# Patient Record
Sex: Male | Born: 1940
Health system: Southern US, Community
[De-identification: ages and names within clinical notes are randomized; demographics above are authoritative.]

## PROBLEM LIST (undated history)

## (undated) DIAGNOSIS — I483 Typical atrial flutter: Principal | ICD-10-CM

## (undated) DIAGNOSIS — E785 Hyperlipidemia, unspecified: Secondary | ICD-10-CM

## (undated) DIAGNOSIS — R42 Dizziness and giddiness: Secondary | ICD-10-CM

## (undated) DIAGNOSIS — R0789 Other chest pain: Secondary | ICD-10-CM

## (undated) DIAGNOSIS — I10 Essential (primary) hypertension: Secondary | ICD-10-CM

## (undated) DIAGNOSIS — I219 Acute myocardial infarction, unspecified: Secondary | ICD-10-CM

## (undated) DIAGNOSIS — I429 Cardiomyopathy, unspecified: Secondary | ICD-10-CM

## (undated) DIAGNOSIS — I472 Ventricular tachycardia: Secondary | ICD-10-CM

## (undated) DIAGNOSIS — N289 Disorder of kidney and ureter, unspecified: Secondary | ICD-10-CM

## (undated) DIAGNOSIS — I2510 Atherosclerotic heart disease of native coronary artery without angina pectoris: Secondary | ICD-10-CM

## (undated) DIAGNOSIS — I255 Ischemic cardiomyopathy: Secondary | ICD-10-CM

## (undated) DIAGNOSIS — E782 Mixed hyperlipidemia: Secondary | ICD-10-CM

## (undated) DIAGNOSIS — I25709 Atherosclerosis of coronary artery bypass graft(s), unspecified, with unspecified angina pectoris: Secondary | ICD-10-CM

## (undated) DIAGNOSIS — K219 Gastro-esophageal reflux disease without esophagitis: Secondary | ICD-10-CM

## (undated) HISTORY — DX: Typical atrial flutter: I48.3

## (undated) HISTORY — DX: Mixed hyperlipidemia: E78.2

## (undated) HISTORY — DX: Ventricular tachycardia: I47.2

## (undated) HISTORY — PX: TONSILLECTOMY: SUR1361

## (undated) HISTORY — PX: BACK SURGERY: SHX140

## (undated) HISTORY — DX: Atherosclerosis of coronary artery bypass graft(s), unspecified, with unspecified angina pectoris: I25.709

## (undated) HISTORY — DX: Cardiomyopathy, unspecified: I42.9

## (undated) HISTORY — DX: Essential (primary) hypertension: I10

## (undated) HISTORY — PX: CORONARY ARTERY BYPASS GRAFT: SHX141

## (undated) HISTORY — PX: CARDIAC SURGERY: SHX584

## (undated) HISTORY — PX: ADENOIDECTOMY: SUR15

## (undated) HISTORY — DX: Dizziness and giddiness: R42

## (undated) HISTORY — DX: Other chest pain: R07.89

---

## 2004-06-29 ENCOUNTER — Encounter: Payer: Self-pay | Admitting: Cardiology

## 2009-06-07 ENCOUNTER — Ambulatory Visit: Payer: Self-pay | Admitting: Orthopedic Surgery

## 2009-06-07 DIAGNOSIS — M7580 Other shoulder lesions, unspecified shoulder: Secondary | ICD-10-CM

## 2009-06-07 DIAGNOSIS — M25519 Pain in unspecified shoulder: Secondary | ICD-10-CM

## 2009-09-29 ENCOUNTER — Encounter: Payer: Self-pay | Admitting: Cardiology

## 2009-11-03 ENCOUNTER — Encounter: Payer: Self-pay | Admitting: Cardiology

## 2009-11-17 ENCOUNTER — Ambulatory Visit: Payer: Self-pay | Admitting: Cardiology

## 2009-11-17 DIAGNOSIS — E782 Mixed hyperlipidemia: Secondary | ICD-10-CM

## 2009-11-17 DIAGNOSIS — I429 Cardiomyopathy, unspecified: Secondary | ICD-10-CM

## 2009-11-17 DIAGNOSIS — I25709 Atherosclerosis of coronary artery bypass graft(s), unspecified, with unspecified angina pectoris: Secondary | ICD-10-CM | POA: Insufficient documentation

## 2009-11-17 DIAGNOSIS — F17200 Nicotine dependence, unspecified, uncomplicated: Secondary | ICD-10-CM

## 2009-11-17 DIAGNOSIS — I10 Essential (primary) hypertension: Secondary | ICD-10-CM

## 2009-11-17 HISTORY — DX: Mixed hyperlipidemia: E78.2

## 2009-11-17 HISTORY — DX: Atherosclerosis of coronary artery bypass graft(s), unspecified, with unspecified angina pectoris: I25.709

## 2009-11-17 HISTORY — DX: Essential (primary) hypertension: I10

## 2009-11-17 HISTORY — DX: Cardiomyopathy, unspecified: I42.9

## 2009-11-19 ENCOUNTER — Ambulatory Visit: Payer: Self-pay | Admitting: Cardiology

## 2009-12-07 ENCOUNTER — Telehealth (INDEPENDENT_AMBULATORY_CARE_PROVIDER_SITE_OTHER): Payer: Self-pay | Admitting: *Deleted

## 2009-12-29 ENCOUNTER — Ambulatory Visit: Payer: Self-pay | Admitting: Cardiology

## 2009-12-29 ENCOUNTER — Encounter: Payer: Self-pay | Admitting: Cardiology

## 2010-01-10 ENCOUNTER — Ambulatory Visit: Payer: Self-pay | Admitting: Cardiology

## 2010-01-10 DIAGNOSIS — I255 Ischemic cardiomyopathy: Secondary | ICD-10-CM

## 2010-02-07 ENCOUNTER — Encounter: Payer: Self-pay | Admitting: Cardiology

## 2010-02-15 ENCOUNTER — Ambulatory Visit: Payer: Self-pay | Admitting: Cardiology

## 2010-05-24 NOTE — Assessment & Plan Note (Signed)
Summary: DISCUSS STRESS TEST RESULTS PER DR. Ashlon Lottman   Visit Type:  Follow-up Primary Provider:  Dr. Dwana Melena   History of Present Illness: 70 year old male presents for followup. I saw him in July as a new patient with history detailed below. He was referred for a followup 2-D echocardiogram to reassess LVEF. This study revealed an LVEF of 25-30% with wall motion abnormalities. Followup Cardiolite was then undertaken to assess ischemic burden with findings of fairly extensive scar affecting the anterior, septal, and inferior apical regions with LVEF of 23%. No ischemia was noted.  I reviewed these test results with the patient in detail today, and discussed the implications. He remains stable clinically without any active angina, and NYHA class II dyspnea on exertion. We discussed his medical therapy, and also reviewed the issue of potential prophylactic defibrillator placement. He states that this has not been discussed with him at any time previously. He was not prepared to make a decision about this today, however.  Heart rate has been slow overall. We discussed attempting to reduce carvedilol dosing, perhaps also to allow some blood pressure for the institution of low-dose ACE inhibitor. This would round out his medical regimen.  We also discussed smoking cessation. He states that he is considering the matter, and will likely be able to come to a quit date fairly soon. We talked about the possibility of using Wellbutrin and nicotine replacement.  Preventive Screening-Counseling & Management  Alcohol-Tobacco     Smoking Status: current     Smoking Cessation Counseling: yes     Packs/Day: 3/4 PPD     Year Started: 1958  Current Medications (verified): 1)  Carvedilol 12.5 Mg Tabs (Carvedilol) .... Take 1/2 Tab (6.25mg ) Two Times A Day 2)  Spironolactone 25 Mg Tabs (Spironolactone) .... Take 1 Tablet By Mouth Once A Day 3)  Lipitor 40 Mg Tabs (Atorvastatin Calcium) .... Take 1 Tablet By  Mouth Once A Day 4)  Aspirin 325 Mg Tabs (Aspirin) .... Take 2 Tablet By Mouth Once A Day  Allergies (verified): 1)  ! Sulfa  Comments:  Nurse/Medical Assistant: The patient's medication list and allergies were reviewed with the patient and were updated in the Medication and Allergy Lists.  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         Otherwise reviewed and negative.  Vital Signs:  Patient profile:   70 year old male Height:      74 inches Weight:      243 pounds Pulse rate:   40 / minute BP sitting:   108 / 70  (left arm) Cuff size:   large  Vitals Entered By: Carlye Grippe (January 10, 2010 3:39 PM)   Physical Exam  Additional Exam:  Obese male in no acute distress. HEENT: Conjunctiva and lids normal, oropharynx clear. Neck: Supple, no elevated JVP or bruits. Lungs: Clear to auscultation, nonlabored. Cardiac: Regular rate and rhythm, no S3 or rub.  Abdomen: Obese, nontender, bowel sounds present, no bruits. Skin: Warm and dry. Extremities: No pitting edema, distal pulses 1-2+. Musculoskeletal: No gross deformities. Neuropsychiatric: Alert and oriented x3, affect appropriate.   Echocardiogram  Procedure date:  11/19/2009  Findings:      Mild LV chamber dilatation with mild LVH, LVEF 25-30%, inferolateral and inferior  hypokinesis with anteroseptal and apical akinesis. Mitral annual calcification with mild mitral regurgitation. Mild aortic leaflet calcification with annular calcification. Trace tricuspid regurgitation.  Nuclear Study  Procedure date:  12/29/2009  Findings:      Lexiscan Cardiolite with no diagnostic ST changes. LVEF 23%. Evidence of extensive scar in the anterior, septal, inferior, and apical region. No significant ischemia  Impression & Recommendations:  Problem # 1:  ISCHEMIC CARDIOMYOPATHY (ICD-414.8)  As noted above, LVEF approximately 25%, with Cardiolite demonstrating fairly extensive scar but no active ischemia. He is symptomatically stable with no angina and NYHA class II dyspnea on exertion. I discussed the implications of his testing, plans for medical therapy modification, and also potential referral to electrophysiology to discuss prophylactic defibrillator placement. He would like to consider this matter further before making such a visit. I plan to see him back in one month.  His updated medication list for this problem includes:    Carvedilol 12.5 Mg Tabs (Carvedilol) .Marland Kitchen... Take 1/2 tab (6.25mg ) two times a day    Spironolactone 25 Mg Tabs (Spironolactone) .Marland Kitchen... Take 1 tablet by mouth once a day    Aspirin 325 Mg Tabs (Aspirin) .Marland Kitchen... Take 2 tablet by mouth once a day  Problem # 2:  TOBACCO ABUSE (ICD-305.1)  We continue to discuss smoking cessation. If he is able to reach a quit date, we will likely end up utilizing Wellbutrin SR and nicotine replacement.  Problem # 3:  CORONARY ATHEROSCLEROSIS NATIVE CORONARY ARTERY (ICD-414.01)  No active angina. Patient fairly bradycardic on carvedilol at present dose. Plan to reduce from 12.5 mg twice daily to 6.25 mg twice daily.  His updated medication list for this problem includes:    Carvedilol 12.5 Mg Tabs (Carvedilol) .Marland Kitchen... Take 1/2 tab (6.25mg ) two times a day    Aspirin 325 Mg Tabs (Aspirin) .Marland Kitchen... Take 2 tablet by mouth  once a day  Problem # 4:  ESSENTIAL HYPERTENSION, BENIGN (ICD-401.1)  Blood pressure very well controlled, in fact limits utilization of ACE inhibitor at this time.  His updated medication list for this problem includes:    Carvedilol 12.5 Mg Tabs (Carvedilol) .Marland Kitchen... Take 1/2 tab (6.25mg ) two times a day    Spironolactone 25 Mg Tabs (Spironolactone) .Marland Kitchen... Take 1 tablet by mouth once a day    Aspirin 325 Mg Tabs (Aspirin) .Marland Kitchen... Take 2 tablet by mouth once a day  Patient Instructions: 1)  Decrease Coreg to 6.25mg  two times a day  2)  Follow up in  1 month

## 2010-05-24 NOTE — Letter (Signed)
Summary: History form  History form   Imported By: Jacklynn Ganong 06/14/2009 08:16:05  _____________________________________________________________________  External Attachment:    Type:   Image     Comment:   External Document

## 2010-05-24 NOTE — Letter (Signed)
Summary: External Correspondence/ [facility] RECORDS  External Correspondence/ [facility]   Imported By: Dorise Hiss 11/25/2009 14:03:47  _____________________________________________________________________  External Attachment:    Type:   Image     Comment:   External Document  Appended Document: External Correspondence/ [facility] Records reviewed. Patient with anterior wall MI in 1999 treated with thrombolytics, bare-metal stent to the proximal LAD 1999 with unsuccessful wiring of the mid LAD, subsequent Cypher drug-eluting stent placement to the right coronary artery x2 in 2005, and ultimately coronary artery bypass grafting in March 2006. Cardiac catheterization at that time demonstrated a left ventricular ejection fraction of 20%, occlusion of the right coronary artery distally at site of previous stent placement, 50-60% circumflex stenosis, patent stent site in the proximal left anterior descending with occlusion in the mid vessel segment, 99% second diagonal stenosis. Patient underwent placement of an SVG to the circumflex and diagonal.

## 2010-05-24 NOTE — Assessment & Plan Note (Signed)
Summary: RT SHOULDER PAIN/NEEDS XRAY/REF Z.HALL/?[facility]   Visit Type:  Initial Consult  CC:  right shoulder pain.  History of Present Illness: I saw Harold English in the office today for an initial visit.  He is a 70 years old man with the complaint of:  RIGHT SHOULDER PAIN.  The patient started having pain 3-4 months ago and complained of sharp mild to severe pain which is intermittent and worse with certain motions of the arm.  He says he did have an injury he doesn't know the date.  He does get relief with aspirin but gets increased pain with lifting his arm.  Xrays today, in our office.  Medications: Aspirin, heart medicine? 3 pills.  Pain for 3-4 months.  Allergies (verified): 1)  ! Sulfa  Past History:  Past Medical History: Bypass  Past Surgical History: Heart bypass Back  Review of Systems General:  Complains of fever, chills, and fatigue; denies weight loss and weight gain. Cardiac :  Complains of chest pain; denies angina, heart attack, heart failure, poor circulation, blood clots, and phlebitis; murmur. Resp:  Complains of short of breath and cough; denies difficulty breathing, COPD, and pneumonia. GI:  Complains of nausea, vomiting, and diarrhea; denies constipation, difficulty swallowing, ulcers, GERD, and reflux; heartburn. GU:  Denies kidney failure, kidney transplant, kidney stones, burning, poor stream, testicular cancer, blood in urine, and . Neuro:  Complains of numbness; denies headache, dizziness, migraines, weakness, tremor, and unsteady walking; tingling. MS:  Complains of joint pain; denies rheumatoid arthritis, joint swelling, gout, bone cancer, osteoporosis, and ; stiffness and muscle pain . Endo:  Denies thyroid disease, goiter, and diabetes. Psych:  Denies depression, mood swings, anxiety, panic attack, bipolar, and schizophrenia. Derm:  Denies eczema, cancer, and itching; changes in the skin. EENT:  Denies poor vision, cataracts, glaucoma, poor  hearing, vertigo, ears ringing, sinusitis, hoarseness, toothaches, and bleeding gums; blurred vision, eye pain, and headache. Immunology:  Denies seasonal allergies, sinus problems, and allergic to bee stings. Lymphatic:  Denies lymph node cancer and lymph edema.  Physical Exam  Additional Exam:  GEN: well developed, well nourished, normal grooming and hygiene, no deformity and normal body habitus. vital stable as recorded  CDV: pulses are normal, no edema, no erythema. no tenderness  Lymph: normal lymph nodes   Skin: no rashes, skin lesions or open sores   NEURO: normal coordination, reflexes, sensation.   Psyche: awake, alert and oriented. Mood normal   Gait: normal  RIGHT shoulder inspection reveals tenderness along the anterolateral acromial and deltoid areas.  His range of motion remains full.  He has grade 5 motor strength.  His shoulder is stable.  He has a positive impingement sign.  His LEFT shoulder is nontender, has full range of motion with grade 5 strength and no joint laxity     Impression & Recommendations:  Problem # 1:  IMPINGEMENT SYNDROME (ICD-726.2) Assessment New  Orders: New Patient Level III (56213) Shoulder x-ray,  minimum 2 views (08657) 2 views of the glenohumeral joint   The glenohumeral joint space is normal. The bony anatomy is without bone lesion. The acromion is a Type II normal shoulder  Problem # 2:  SHOULDER PAIN (ICD-719.41) Assessment: New  I think we can manage him with a simple exercise program he will try this first and see how things go if no improvement and he will return  Orders: New Patient Level III (84696) Shoulder x-ray,  minimum 2 views (29528)  Patient Instructions: 1)  Limit  activity to comfort and avoid activities that increase discomfort.  Apply moist heat and/or ice to shoulder and take medication as instructed for pain relief. Please read the Shoulder Pain Handout and start exercises  as directed. 2)  return if  still having pain after 3 weeks [call for appointment]

## 2010-05-24 NOTE — Assessment & Plan Note (Signed)
Summary: NP-ELEVATED BNP   Visit Type:  Initial Consult Primary Provider:  Dr. Dwana Melena   History of Present Illness: 70 year old male presents to establish cardiology followup.  He previously lived at [facility], relocated to [place], and had his prior cardiology followup in [place], [place]. He denies any significant angina and reports NYHA class II dyspnea on exertion. He states that he and his wife have been remodeling a house, although not specifically exercising regularly.  I reviewed his available records which are summarized below.  He reports undergoing a stress test approximately one year ago that was "okay." Not certain about most recent assessment of LVEF.  He denies any problems with palpitations, syncope, orthopnea, or PND.  He had a recent full lipid profile with particle size assessment per Dr. Margo Aye. These results were scanned into EMR. LDL was most recently in the 120s. His Lipitor was advanced to 40 mg daily.  I do not have his bypass graft anatomy as yet for review.  Preventive Screening-Counseling & Management  Alcohol-Tobacco     Smoking Status: current     Smoking Cessation Counseling: yes     Packs/Day: 1 PPD  Current Medications (verified): 1)  Carvedilol 12.5 Mg Tabs (Carvedilol) .... Take 1 Tablet By Mouth Two Times A Day 2)  Spironolactone 25 Mg Tabs (Spironolactone) .... Take 1 Tablet By Mouth Once A Day 3)  Lipitor 40 Mg Tabs (Atorvastatin Calcium) .... Take 1 Tablet By Mouth Once A Day 4)  Aspir-Trin 325 Mg Tbec (Aspirin) .... Take 1 Tablet By Mouth Once A Day  Allergies (verified): 1)  ! Sulfa  Comments:  Nurse/Medical Assistant: The patient's medications bottles and allergies were reviewed with the patient and were updated in the Medication and Allergy Lists.  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  Past Medical History: Hyperlipidemia Hypertension Basal cell skin cancer CAD - presumably multivessel, prior BMS and DES to LAD 2004, LVEF 35% Myocardial Infarction - 2004 BPH Arthritis Cluster headaches  Past Surgical History: Back surgery Tonsillectomy CABG 2004, [place]  Family History: Father: "heart problems " Mother: liver cancer 2 Uncles with "heart problems"  Social History: Married  Tobacco Use - Yes Drug Use - no 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         Otherwise reviewed and negative.  Vital Signs:  Patient profile:   70 year old male Height:      74 inches Weight:      246 pounds BMI:     31.70 Pulse rate:   96 / minute BP sitting:   115 / 73  (left arm) Cuff size:   large  Vitals Entered By: Carlye Grippe (November 17, 2009 8:22 AM)  Nutrition Counseling: Patient's BMI is greater than 25 and therefore counseled on weight management options.  Physical Exam  Additional Exam:  Obese male in no acute distress. HEENT: Conjunctiva and lids normal, oropharynx clear. Neck: Supple, no elevated JVP or bruits. Lungs: Clear to auscultation, nonlabored. Cardiac: Regular rate and  rhythm, no S3 or rub. Abdomen: Obese, nontender, bowel sounds present, no bruits. Skin: Warm and dry. Extremities: No pitting edema, distal pulses 1-2+. Musculoskeletal: No gross deformities. Neuropsychiatric: Alert and oriented x3, affect appropriate.   EKG  Procedure date:  11/17/2009  Findings:      Sinus rhythm at 75 beats per minute with premature  ventricular complexes and right bundle branch block pattern.  Impression & Recommendations:  Problem # 1:  CORONARY ATHEROSCLEROSIS NATIVE CORONARY ARTERY (ICD-414.01)  Symptomatically stable without active angina on medical therapy. Patient reports a reassuring stress test approximately one year ago in [place]. I reviewed his medications today. We discussed rationale for possible adjustments over time. I recommended that he consider a regular walking regimen for exercise. Will request bypass graft records. A 6 month visit will be scheduled.  His updated medication list for this problem includes:    Carvedilol 12.5 Mg Tabs (Carvedilol) .Marland Kitchen... Take 1 tablet by mouth two times a day    Aspir-trin 325 Mg Tbec (Aspirin) .Marland Kitchen... Take 1 tablet by mouth once a day  Orders: 2-D Echocardiogram (2D Echo)  Problem # 2:  UNSPECIFIED SECONDARY CARDIOMYOPATHY (ICD-425.9)  LVEF reported at 35% as of 2004 based on available information. A followup echocardiogram will be obtained. This may help to guide further medication adjustments.  His updated medication list for this problem includes:    Carvedilol 12.5 Mg Tabs (Carvedilol) .Marland Kitchen... Take 1 tablet by mouth two times a day    Spironolactone 25 Mg Tabs (Spironolactone) .Marland Kitchen... Take 1 tablet by mouth once a day    Aspir-trin 325 Mg Tbec (Aspirin) .Marland Kitchen... Take 1 tablet by mouth once a day  Problem # 3:  MIXED HYPERLIPIDEMIA (ICD-272.2)  Recent lipid profile with particle size assessment is reviewed. LDL is not at goal. Agree with increase in Lipitor to 40 mg daily. Followup labs to be checked by Dr. Margo Aye over the next few months.  His updated medication list for this problem includes:    Lipitor 40 Mg Tabs (Atorvastatin calcium) .Marland Kitchen... Take 1 tablet by mouth once a day  Problem # 4:  TOBACCO ABUSE (ICD-305.1)  We discussed smoking cessation today. Patient tried Chantix in the past without success. He was able to quit for a period of time more remotely. We  talked about trying to pick a quit date along with his wife who also smokes, and we might consider using Wellbutrin and subsequently a combination of nicotine replacement therapy.  Other Orders: EKG w/ Interpretation (93000)  Patient Instructions: 1)  Your physician has requested that you have an echocardiogram.  Echocardiography is a painless test that uses sound waves to create images of your heart. It provides your doctor with information about the size and shape of your heart and how well your heart's chambers and valves are working.  This procedure takes approximately one hour. There are no restrictions for this procedure. If the results of your test are normal or stable, you will receive a letter. If they are abnormal, the nurse will contact you by phone.  2)  Your physician wants you to follow-up in: 6 months. You will receive a reminder letter in the mail one-two months in advance. If you don't receive a letter, please call our office to schedule the follow-up appointment. 3)  Your physician discussed the hazards of tobacco use.  Tobacco use cessation is recommended and techniques and options to help you quit were discussed.

## 2010-05-24 NOTE — Progress Notes (Signed)
Summary: PHONE: MEDICATIONS AND LEXISCAN   Phone Note Call from Patient Call back at Home Phone [phone]   Caller: wife- ann Reason for Call: Talk to Nurse Summary of Call: Spironolactone 25 Mg Tabs (Spironolactone) .Marland Kitchen... Take 1 tablet by mouth once a day  Called patient to verify his Lexiscan cardiolite and his wife wanted to know about taking his fluid pill before the test. Told her that I would check with you and that you would give her a call back regarding medications before his test.  Initial call taken by: Zachary George,  December 07, 2009 3:31 PM  Follow-up for Phone Call        Pt states he usually takes Spironolactone at night. He is aware he can take this medication before the stress test unless he feels this will make him have to urinate frequently. Pt verbalized understanding.  Follow-up by: Cyril Loosen, RN, BSN,  December 07, 2009 5:10 PM

## 2010-05-24 NOTE — Assessment & Plan Note (Signed)
Summary: f50m --agh   Visit Type:  Follow-up Primary Provider:  Dr. Dwana Melena   History of Present Illness: 70 year old male presents for follow-up. He was just recently seen in September. At that visit we discussed his cardiac testing with findings of LVEF approximately 25%, Cardiolite demonstrating fairly extensive scar but no active ischemia. He was symptomatically stable with no angina and NYHA class II dyspnea on exertion. I discussed the implications of his testing, plans for medical therapy modification, and also potential referral to electrophysiology to discuss prophylactic defibrillator placement.   He has elected not to pursue consideration of the fibrillator at this time. He may reconsider later date.  He did note some relative increase in his energy after cutting Coreg in half, however has more ectopy and heart rate has increased, not as optimally controlled.  He did pick a smoking cessation quit date on November 1. He has already been started on Wellbutrin SR by Dr. Margo Aye.  I asked him to start using nicotine patches on his quit date. I also gave him the [employer] number.  Preventive Screening-Counseling & Management  Alcohol-Tobacco     Smoking Status: current     Packs/Day: 0.5     Year Started: 50 years  Comments: trying to quit, set date of November 1  Current Medications (verified): 1)  Carvedilol 12.5 Mg Tabs (Carvedilol) .... Take 1/2 Tab (6.25mg ) Two Times A Day 2)  Spironolactone 25 Mg Tabs (Spironolactone) .... Take 1 Tablet By Mouth Once A Day 3)  Lipitor 40 Mg Tabs (Atorvastatin Calcium) .... Take 1 Tablet By Mouth Once A Day 4)  Aspirin 325 Mg Tabs (Aspirin) .... Take 2 Tablet By Mouth Once A Day 5)  Wellbutrin Xl 150 Mg Xr24h-Tab (Bupropion Hcl) .... Take 1 Tablet By Mouth Once A Day  Allergies: 1)  ! 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         Otherwise reviewed and negative.  Vital Signs:  Patient profile:   70 year old male Height:      74 inches Weight:      244 pounds Pulse rate:   90 / minute BP sitting:   109 / 67  (right arm) Cuff size:   regular  Vitals Entered By: Hoover Brunette, LPN (February 15, 2010 2:49 PM) Is Patient Diabetic? No   Physical Exam  Additional Exam:  Obese male in no acute distress. HEENT: Conjunctiva and lids normal, oropharynx clear. Neck: Supple, no elevated JVP or bruits. Lungs: Clear to auscultation, nonlabored. Cardiac: Regular rate and rhythm with frequent ectopy, no S3 or rub. Abdomen: Obese, nontender, bowel sounds present, no bruits. Skin: Warm and dry. Extremities: No pitting edema, distal pulses 1-2+. Musculoskeletal: No gross deformities. Neuropsychiatric: Alert and oriented x3, affect appropriate.   Prior Report Reviewed for  Echocardiogram:  Findings: 11/19/2009 Mild LV chamber dilatation with mild LVH, LVEF 25-30%, inferolateral and inferior hypokinesis with anteroseptal and apical akinesis. Mitral annual calcification with mild mitral regurgitation. Mild aortic leaflet calcification with annular calcification. Trace tricuspid regurgitation.  Comments:    Prior Report Reviewed for Nuclear Study:  Findings: 12/29/2009 Lexiscan  Cardiolite with no diagnostic ST changes. LVEF 23%. Evidence of extensive scar in the anterior, septal, inferior, and apical region. No significant ischemia  Impression & Recommendations:  Problem # 1:  ISCHEMIC CARDIOMYOPATHY (ICD-414.8)  Continue medical therapy at the present time. Recent ischemic workup is noted above demonstrating no active ischemia. Patient is not interested in pursuing a defibrillator at this time. I will continue to discuss this with him over time. Plan to increase Coreg back to 12.5 mg b.i.d. Followup arranged for the next 3 months.  His updated medication list for this problem includes:    Carvedilol 12.5 Mg Tabs (Carvedilol) .Marland Kitchen... Take 1 tablet by mouth two times a day    Spironolactone 25 Mg Tabs (Spironolactone) .Marland Kitchen... Take 1 tablet by mouth once a d     Aspirin 325 Mg Tabs (Aspirin) .Marland Kitchen... Take 2 tablet by mouth once a day  Problem # 2:  TOBACCO ABUSE (ICD-305.1)  Quit date is November 1. He is already on Wellbutrin SR. Plan is to use nicotine patches. Quit Line number is also provided.  Problem # 3:  ESSENTIAL HYPERTENSION, BENIGN (ICD-401.1)  Blood pressure very well controlled.  His updated medication list for this problem includes:    Carvedilol 12.5 Mg Tabs (Carvedilol) .Marland Kitchen... Take 1 tablet by mouth two times a day    Spironolactone 25 Mg Tabs (Spironolactone) .Marland Kitchen... Take 1 tablet by mouth once a day    Aspirin 325 Mg Tabs (Aspirin) .Marland Kitchen... Take 2 tablet by mouth once a day  Problem # 4:  MIXED HYPERLIPIDEMIA (ICD-272.2)  Will review recent labs obtained by Dr. Margo Aye.  His updated medication list for this problem includes:    Lipitor 40 Mg Tabs (Atorvastatin calcium) .Marland Kitchen... Take 1 tablet by mouth once a day  Patient Instructions: 1)  Your physician wants you to follow-up in: 3 months. You will receive a reminder letter in the mail one-two months in advance. If you don't receive a letter, please call our office to schedule the follow-up appointment. 2)   Increase Coreg (carvedilol) to 12.5mg  1 tablet by mouth two times a day.  3)  We have requested your recent labs from your primary MD.

## 2010-05-24 NOTE — Letter (Signed)
Summary: External Correspondence/ OFFICE VISIT DR. ZACK HALL  External Correspondence/ OFFICE VISIT DR. ZACK HALL   Imported By: Dorise Hiss 11/01/2009 10:36:48  _____________________________________________________________________  External Attachment:    Type:   Image     Comment:   External Document

## 2010-05-24 NOTE — Medication Information (Signed)
Summary: RX Folder/ MED LIST DR. ZACK HALL  RX Folder/ MED LIST DR. ZACK HALL   Imported By: Dorise Hiss 11/04/2009 12:08:20  _____________________________________________________________________  External Attachment:    Type:   Image     Comment:   External Document

## 2013-09-06 ENCOUNTER — Emergency Department (HOSPITAL_COMMUNITY)
Admission: EM | Admit: 2013-09-06 | Discharge: 2013-09-06 | Disposition: A | Discharge status: Home / Self Care | Payer: 59 | Attending: Emergency Medicine | Admitting: Emergency Medicine

## 2013-09-06 ENCOUNTER — Encounter (HOSPITAL_COMMUNITY): Payer: Self-pay | Admitting: Emergency Medicine

## 2013-09-06 ENCOUNTER — Emergency Department (HOSPITAL_COMMUNITY): Payer: 59

## 2013-09-06 DIAGNOSIS — I252 Old myocardial infarction: Secondary | ICD-10-CM | POA: Insufficient documentation

## 2013-09-06 DIAGNOSIS — Z9889 Other specified postprocedural states: Secondary | ICD-10-CM | POA: Insufficient documentation

## 2013-09-06 DIAGNOSIS — Z951 Presence of aortocoronary bypass graft: Secondary | ICD-10-CM | POA: Insufficient documentation

## 2013-09-06 DIAGNOSIS — R062 Wheezing: Secondary | ICD-10-CM

## 2013-09-06 DIAGNOSIS — J189 Pneumonia, unspecified organism: Secondary | ICD-10-CM

## 2013-09-06 DIAGNOSIS — F17200 Nicotine dependence, unspecified, uncomplicated: Secondary | ICD-10-CM | POA: Insufficient documentation

## 2013-09-06 DIAGNOSIS — J3489 Other specified disorders of nose and nasal sinuses: Secondary | ICD-10-CM | POA: Insufficient documentation

## 2013-09-06 DIAGNOSIS — J159 Unspecified bacterial pneumonia: Secondary | ICD-10-CM | POA: Insufficient documentation

## 2013-09-06 HISTORY — DX: Acute myocardial infarction, unspecified: I21.9

## 2013-09-06 MED ORDER — BENZONATATE 100 MG PO CAPS: 200.0000 mg | ORAL_CAPSULE | Freq: Three times a day (TID) | ORAL | Status: DC

## 2013-09-06 MED ORDER — PREDNISONE 20 MG PO TABS: 60.0000 mg | ORAL_TABLET | Freq: Every day | ORAL | Status: DC

## 2013-09-06 MED ORDER — ALBUTEROL SULFATE HFA 108 (90 BASE) MCG/ACT IN AERS
2.0000 | INHALATION_SPRAY | RESPIRATORY_TRACT | Status: DC | PRN
  Filled 2013-09-06: qty 6.7

## 2013-09-06 MED ORDER — IPRATROPIUM-ALBUTEROL 0.5-2.5 (3) MG/3ML IN SOLN
3.0000 mL | Freq: Once | RESPIRATORY_TRACT | Status: AC
Start: 2013-09-06 — End: 2013-09-06
  Administered 2013-09-06: 3 mL via RESPIRATORY_TRACT
  Filled 2013-09-06: qty 3

## 2013-09-06 MED ORDER — CEFTRIAXONE SODIUM 250 MG IJ SOLR
250.0000 mg | Freq: Once | INTRAMUSCULAR | Status: AC
  Administered 2013-09-06: 250 mg via INTRAMUSCULAR
  Filled 2013-09-06: qty 250

## 2013-09-06 MED ORDER — AZITHROMYCIN 250 MG PO TABS: 250.0000 mg | ORAL_TABLET | Freq: Every day | ORAL | Status: DC

## 2013-09-06 MED ORDER — ALBUTEROL SULFATE (2.5 MG/3ML) 0.083% IN NEBU
2.5000 mg | INHALATION_SOLUTION | Freq: Once | RESPIRATORY_TRACT | Status: AC
  Administered 2013-09-06: 2.5 mg via RESPIRATORY_TRACT
  Filled 2013-09-06: qty 3

## 2013-09-06 MED ORDER — PREDNISONE 50 MG PO TABS
60.0000 mg | ORAL_TABLET | Freq: Once | ORAL | Status: AC
  Administered 2013-09-06: 60 mg via ORAL
  Filled 2013-09-06 (×2): qty 1

## 2013-09-06 MED ORDER — AZITHROMYCIN 250 MG PO TABS
500.0000 mg | ORAL_TABLET | Freq: Once | ORAL | Status: AC
  Administered 2013-09-06: 500 mg via ORAL
  Filled 2013-09-06: qty 2

## 2013-09-06 NOTE — Discharge Instructions (Signed)
Pneumonia, Adult °Pneumonia is an infection of the lungs.  °CAUSES °Pneumonia may be caused by bacteria or a virus. Usually, these infections are caused by breathing infectious particles into the lungs (respiratory tract). °SYMPTOMS  °· Cough. °· Fever. °· Chest pain. °· Increased rate of breathing. °· Wheezing. °· Mucus production. °DIAGNOSIS  °If you have the common symptoms of pneumonia, your caregiver will typically confirm the diagnosis with a chest X-ray. The X-ray will show an abnormality in the lung (pulmonary infiltrate) if you have pneumonia. Other tests of your blood, urine, or sputum may be done to find the specific cause of your pneumonia. Your caregiver may also do tests (blood gases or pulse oximetry) to see how well your lungs are working. °TREATMENT  °Some forms of pneumonia may be spread to other people when you cough or sneeze. You may be asked to wear a mask before and during your exam. Pneumonia that is caused by bacteria is treated with antibiotic medicine. Pneumonia that is caused by the influenza virus may be treated with an antiviral medicine. Most other viral infections must run their course. These infections will not respond to antibiotics.  °PREVENTION °A pneumococcal shot (vaccine) is available to prevent a common bacterial cause of pneumonia. This is usually suggested for: °· People over 65 years old. °· Patients on chemotherapy. °· People with chronic lung problems, such as bronchitis or emphysema. °· People with immune system problems. °If you are over 65 or have a high risk condition, you may receive the pneumococcal vaccine if you have not received it before. In some countries, a routine influenza vaccine is also recommended. This vaccine can help prevent some cases of pneumonia. You may be offered the influenza vaccine as part of your care. °If you smoke, it is time to quit. You may receive instructions on how to stop smoking. Your caregiver can provide medicines and counseling to  help you quit. °HOME CARE INSTRUCTIONS  °· Cough suppressants may be used if you are losing too much rest. However, coughing protects you by clearing your lungs. You should avoid using cough suppressants if you can. °· Your caregiver may have prescribed medicine if he or she thinks your pneumonia is caused by a bacteria or influenza. Finish your medicine even if you start to feel better. °· Your caregiver may also prescribe an expectorant. This loosens the mucus to be coughed up. °· Only take over-the-counter or prescription medicines for pain, discomfort, or fever as directed by your caregiver. °· Do not smoke. Smoking is a common cause of bronchitis and can contribute to pneumonia. If you are a smoker and continue to smoke, your cough may last several weeks after your pneumonia has cleared. °· A cold steam vaporizer or humidifier in your room or home may help loosen mucus. °· Coughing is often worse at night. Sleeping in a semi-upright position in a recliner or using a couple pillows under your head will help with this. °· Get rest as you feel it is needed. Your body will usually let you know when you need to rest. °SEEK IMMEDIATE MEDICAL CARE IF:  °· Your illness becomes worse. This is especially true if you are elderly or weakened from any other disease. °· You cannot control your cough with suppressants and are losing sleep. °· You begin coughing up blood. °· You develop pain which is getting worse or is uncontrolled with medicines. °· You have a fever. °· Any of the symptoms which initially brought you in for treatment   are getting worse rather than better.  You develop shortness of breath or chest pain. MAKE SURE YOU:   Understand these instructions.  Will watch your condition.  Will get help right away if you are not doing well or get worse. Document Released: 04/10/2005 Document Revised: 07/03/2011 Document Reviewed: 06/30/2010 [facility] Institute[facility] Patient Information 2014 [place], [place].   Take your next  dose of Zithromax tomorrow evening.  Use your albuterol inhaler as instructed, using 2 puffs every 4 hours if you are wheezing.  Take your next dose of prednisone also tomorrow evening.

## 2013-09-06 NOTE — ED Notes (Signed)
Pt walked to end of hall without assistance. No complaints. Pulse Ox 94-97% for duration of walk. Stated he was not having dizziness with walking.

## 2013-09-06 NOTE — ED Provider Notes (Signed)
CSN: [id number]     Arrival date & time 09/06/13  1556 History   First MD Initiated Contact with Patient 09/06/13 1624     Chief Complaint  Patient presents with  . Nasal Congestion  . Cough     (Consider location/radiation/quality/duration/timing/severity/associated sxs/prior Treatment) HPI Comments: Harold English is a 73 y.o. Male with a history significant for MI and cigarette abuse presenting with a one week history of cough,productive of clear sputum, nasal congestion with clear rhinorrhea along with wheezing and lung "crackles".  He denies chest pain.  His symptoms worsen at night, especially when lying on his left side.  His symptoms started shortly after cleaning heavily mildewed furniture with vinegar one week ago.  He denies hemoptysis and peripheral edema.  He endorses possible low grade fever.  He has taken no medicines for his symptoms.     The history is provided by the patient.    Past Medical History  Diagnosis Date  . MI (myocardial infarction)    Past Surgical History  Procedure Laterality Date  . Cardiac surgery    . Coronary artery bypass graft    . Back surgery    . Tonsillectomy    . Adenoidectomy     History reviewed. No pertinent family history. History  Substance Use Topics  . Smoking status: Current Every Day Smoker -- 0.75 packs/day for 52 years    Types: Cigarettes  . Smokeless tobacco: Never Used  . Alcohol Use: 2.4 oz/week    4 Shots of liquor per week    Review of Systems  Constitutional: Positive for fever. Negative for chills.  HENT: Positive for congestion and rhinorrhea. Negative for ear pain, sinus pressure, sore throat, trouble swallowing and voice change.   Eyes: Negative for discharge.  Respiratory: Positive for cough, shortness of breath and wheezing. Negative for stridor.   Cardiovascular: Negative for chest pain, palpitations and leg swelling.  Gastrointestinal: Negative for abdominal pain.  Genitourinary: Negative.        Allergies  Sulfonamide derivatives  Home Medications   Prior to Admission medications   Not on File   BP 124/79  Pulse 80  Temp(Src) 98.4 F (36.9 C) (Oral)  Resp 20  Ht 6\' 1"  (1.854 m)  Wt 240 lb (108.863 kg)  BMI 31.67 kg/m2  SpO2 92% Physical Exam  Nursing note and vitals reviewed. Constitutional: He appears well-developed and well-nourished.  HENT:  Head: Normocephalic and atraumatic.  Eyes: Conjunctivae are normal.  Neck: Normal range of motion.  Cardiovascular: Normal rate, regular rhythm, normal heart sounds and intact distal pulses.   Pulmonary/Chest: Effort normal. He has wheezes.  Left sided expiratory wheezing with prolonged respirations, bilateral base rhonchi.  Abdominal: Soft. Bowel sounds are normal. There is no tenderness.  Musculoskeletal: Normal range of motion. He exhibits no edema and no tenderness.  Neurological: He is alert.  Skin: Skin is warm and dry.  Psychiatric: He has a normal mood and affect.    ED Course  Procedures (including critical care time) Labs Review Labs Reviewed - No data to display  Imaging Review Dg Chest 2 View  09/06/2013   CLINICAL DATA:  Cough, congestion  EXAM: CHEST  2 VIEW  COMPARISON:  None.  FINDINGS: Normal cardiac and mediastinal contours status post median sternotomy. Minimal bandlike opacity right mid lung. No pleural effusion or pneumothorax. Regional skeleton is unremarkable.  IMPRESSION: Minimal right mid lung bandlike opacity. Early infiltrate is not entirely excluded. Recommend clinical correlation and possible short-term followup  as clinically indicated.   Electronically Signed   By: Annia Beltrew  Davis M.D.   On: 09/06/2013 17:53     EKG Interpretation   Date/Time:  Saturday Sep 06 2013 18:19:28 EDT Ventricular Rate:  82 PR Interval:  184 QRS Duration: 144 QT Interval:  438 QTC Calculation: 511 R Axis:   89 Text Interpretation:  Sinus rhythm with Premature supraventricular  complexes and with  frequent and consecutive Premature ventricular  complexes Possible Left atrial enlargement Right bundle branch block  Anteroseptal infarct , age undetermined Abnormal ECG No previous ECGs  available in a pattern of bigeminy Confirmed by Manus GunningANCOUR  MD, STEPHEN  [phone] on 09/06/2013 6:27:23 PM      MDM   Final diagnoses:  Community acquired pneumonia  Wheezing    Patients labs and/or radiological studies were viewed and considered during the medical decision making and disposition process.  Patient was also by Dr. Manus Gunningancour prior to discharge home.  Patient was ambulated in the hallway without desaturation and no complaints of shortness of breath, lightheadedness or weakness.  He was given Rocephin IM, Zithromax 500 mg by mouth and prescription for remaining Zithromax.  He was also given an albuterol MDI for home use every 4 hours, prednisone pulse dose and Tessalon to help with coughing.  He was encouraged close followup with his PCP this week, sooner here if symptoms worsen in any way.    Burgess AmorJulie Promiss Labarbera, PA-C 09/06/13 1926

## 2013-09-06 NOTE — ED Notes (Signed)
Pt ambulated without difficulty. Denied dizziness, CP and SOB.

## 2013-09-06 NOTE — ED Notes (Signed)
Patient c/o chest congestion with dry, "hacky" cough. Per patient started x1 week ago after cleaning some furniture and pieces that had been sitting in garage for "a long time" with vinegar. Patient unsure of any fevers.

## 2013-09-07 NOTE — ED Provider Notes (Signed)
Medical screening examination/treatment/procedure(s) were conducted as a shared visit with non-physician practitioner(s) and myself.  I personally evaluated the patient during the encounter.  Cough and congestion x 1 week. No chest pain. Smoker. Started after cleaning mildew.  No distress. No wheezing on my exam.   EKG Interpretation   Date/Time:  Saturday Sep 06 2013 18:19:28 EDT Ventricular Rate:  82 PR Interval:  184 QRS Duration: 144 QT Interval:  438 QTC Calculation: 511 R Axis:   89 Text Interpretation:  Sinus rhythm with Premature supraventricular  complexes and with frequent and consecutive Premature ventricular  complexes Possible Left atrial enlargement Right bundle branch block  Anteroseptal infarct , age undetermined Abnormal ECG No previous ECGs  available in a pattern of bigeminy Confirmed by Manus GunningANCOUR  MD, Tawny Raspberry  [phone]([id number]) on 09/06/2013 6:27:23 PM       Glynn OctaveStephen Kelce Bouton, MD 09/07/13 0111

## 2017-09-16 ENCOUNTER — Inpatient Hospital Stay (HOSPITAL_COMMUNITY)
Admission: EM | Admit: 2017-09-16 | Discharge: 2017-09-19 | DRG: 247 | Disposition: A | Discharge status: Home Health | Payer: Medicare Other | Attending: Internal Medicine | Admitting: Internal Medicine

## 2017-09-16 ENCOUNTER — Encounter (HOSPITAL_COMMUNITY): Payer: Self-pay | Admitting: Cardiology

## 2017-09-16 ENCOUNTER — Emergency Department (HOSPITAL_COMMUNITY): Payer: Medicare Other

## 2017-09-16 DIAGNOSIS — Z6828 Body mass index (BMI) 28.0-28.9, adult: Secondary | ICD-10-CM | POA: Diagnosis not present

## 2017-09-16 DIAGNOSIS — I252 Old myocardial infarction: Secondary | ICD-10-CM

## 2017-09-16 DIAGNOSIS — I25709 Atherosclerosis of coronary artery bypass graft(s), unspecified, with unspecified angina pectoris: Secondary | ICD-10-CM | POA: Diagnosis present

## 2017-09-16 DIAGNOSIS — I472 Ventricular tachycardia, unspecified: Secondary | ICD-10-CM

## 2017-09-16 DIAGNOSIS — I4510 Unspecified right bundle-branch block: Secondary | ICD-10-CM | POA: Diagnosis present

## 2017-09-16 DIAGNOSIS — Z79899 Other long term (current) drug therapy: Secondary | ICD-10-CM

## 2017-09-16 DIAGNOSIS — Z9089 Acquired absence of other organs: Secondary | ICD-10-CM | POA: Diagnosis not present

## 2017-09-16 DIAGNOSIS — I214 Non-ST elevation (NSTEMI) myocardial infarction: Secondary | ICD-10-CM | POA: Diagnosis present

## 2017-09-16 DIAGNOSIS — Z8249 Family history of ischemic heart disease and other diseases of the circulatory system: Secondary | ICD-10-CM

## 2017-09-16 DIAGNOSIS — K761 Chronic passive congestion of liver: Secondary | ICD-10-CM | POA: Diagnosis present

## 2017-09-16 DIAGNOSIS — N189 Chronic kidney disease, unspecified: Secondary | ICD-10-CM

## 2017-09-16 DIAGNOSIS — R0789 Other chest pain: Secondary | ICD-10-CM | POA: Diagnosis present

## 2017-09-16 DIAGNOSIS — I25110 Atherosclerotic heart disease of native coronary artery with unstable angina pectoris: Secondary | ICD-10-CM | POA: Diagnosis not present

## 2017-09-16 DIAGNOSIS — Z95820 Peripheral vascular angioplasty status with implants and grafts: Secondary | ICD-10-CM | POA: Diagnosis not present

## 2017-09-16 DIAGNOSIS — Z951 Presence of aortocoronary bypass graft: Secondary | ICD-10-CM

## 2017-09-16 DIAGNOSIS — I2510 Atherosclerotic heart disease of native coronary artery without angina pectoris: Secondary | ICD-10-CM | POA: Diagnosis present

## 2017-09-16 DIAGNOSIS — I130 Hypertensive heart and chronic kidney disease with heart failure and stage 1 through stage 4 chronic kidney disease, or unspecified chronic kidney disease: Secondary | ICD-10-CM | POA: Diagnosis present

## 2017-09-16 DIAGNOSIS — I959 Hypotension, unspecified: Secondary | ICD-10-CM | POA: Diagnosis present

## 2017-09-16 DIAGNOSIS — N179 Acute kidney failure, unspecified: Secondary | ICD-10-CM | POA: Diagnosis present

## 2017-09-16 DIAGNOSIS — D72829 Elevated white blood cell count, unspecified: Secondary | ICD-10-CM | POA: Diagnosis present

## 2017-09-16 DIAGNOSIS — N183 Chronic kidney disease, stage 3 (moderate): Secondary | ICD-10-CM | POA: Diagnosis present

## 2017-09-16 DIAGNOSIS — I255 Ischemic cardiomyopathy: Secondary | ICD-10-CM | POA: Diagnosis present

## 2017-09-16 DIAGNOSIS — I5022 Chronic systolic (congestive) heart failure: Secondary | ICD-10-CM | POA: Diagnosis present

## 2017-09-16 DIAGNOSIS — Z882 Allergy status to sulfonamides status: Secondary | ICD-10-CM

## 2017-09-16 DIAGNOSIS — R627 Adult failure to thrive: Secondary | ICD-10-CM | POA: Diagnosis present

## 2017-09-16 DIAGNOSIS — N289 Disorder of kidney and ureter, unspecified: Secondary | ICD-10-CM

## 2017-09-16 DIAGNOSIS — I4891 Unspecified atrial fibrillation: Secondary | ICD-10-CM

## 2017-09-16 DIAGNOSIS — F17200 Nicotine dependence, unspecified, uncomplicated: Secondary | ICD-10-CM | POA: Diagnosis not present

## 2017-09-16 DIAGNOSIS — I10 Essential (primary) hypertension: Secondary | ICD-10-CM | POA: Diagnosis present

## 2017-09-16 DIAGNOSIS — I480 Paroxysmal atrial fibrillation: Secondary | ICD-10-CM | POA: Diagnosis present

## 2017-09-16 DIAGNOSIS — Z716 Tobacco abuse counseling: Secondary | ICD-10-CM | POA: Diagnosis not present

## 2017-09-16 DIAGNOSIS — N170 Acute kidney failure with tubular necrosis: Secondary | ICD-10-CM | POA: Diagnosis not present

## 2017-09-16 DIAGNOSIS — Z955 Presence of coronary angioplasty implant and graft: Secondary | ICD-10-CM

## 2017-09-16 DIAGNOSIS — K219 Gastro-esophageal reflux disease without esophagitis: Secondary | ICD-10-CM | POA: Diagnosis present

## 2017-09-16 DIAGNOSIS — R739 Hyperglycemia, unspecified: Secondary | ICD-10-CM | POA: Diagnosis present

## 2017-09-16 DIAGNOSIS — F17210 Nicotine dependence, cigarettes, uncomplicated: Secondary | ICD-10-CM | POA: Diagnosis present

## 2017-09-16 DIAGNOSIS — E782 Mixed hyperlipidemia: Secondary | ICD-10-CM | POA: Diagnosis present

## 2017-09-16 DIAGNOSIS — I361 Nonrheumatic tricuspid (valve) insufficiency: Secondary | ICD-10-CM | POA: Diagnosis not present

## 2017-09-16 DIAGNOSIS — Z7982 Long term (current) use of aspirin: Secondary | ICD-10-CM

## 2017-09-16 HISTORY — DX: Ischemic cardiomyopathy: I25.5

## 2017-09-16 HISTORY — DX: Essential (primary) hypertension: I10

## 2017-09-16 HISTORY — DX: Ventricular tachycardia, unspecified: I47.20

## 2017-09-16 HISTORY — DX: Other chest pain: R07.89

## 2017-09-16 HISTORY — DX: Gastro-esophageal reflux disease without esophagitis: K21.9

## 2017-09-16 HISTORY — DX: Disorder of kidney and ureter, unspecified: N28.9

## 2017-09-16 HISTORY — DX: Hyperlipidemia, unspecified: E78.5

## 2017-09-16 HISTORY — DX: Atherosclerotic heart disease of native coronary artery without angina pectoris: I25.10

## 2017-09-16 HISTORY — DX: Ventricular tachycardia: I47.2

## 2017-09-16 LAB — CBC
HCT: 43.5 % (ref 39.0–52.0)
HEMOGLOBIN: 14 g/dL (ref 13.0–17.0)
MCH: 30.3 pg (ref 26.0–34.0)
MCHC: 32.2 g/dL (ref 30.0–36.0)
MCV: 94.2 fL (ref 78.0–100.0)
Platelets: 150 10*3/uL (ref 150–400)
RBC: 4.62 MIL/uL (ref 4.22–5.81)
RDW: 13.9 % (ref 11.5–15.5)
WBC: 10.6 10*3/uL — ABNORMAL HIGH (ref 4.0–10.5)

## 2017-09-16 LAB — CBC WITH DIFFERENTIAL/PLATELET
BASOS PCT: 0 %
Basophils Absolute: 0 10*3/uL (ref 0.0–0.1)
Eosinophils Absolute: 0.3 10*3/uL (ref 0.0–0.7)
Eosinophils Relative: 2 %
HEMATOCRIT: 47.5 % (ref 39.0–52.0)
HEMOGLOBIN: 15.9 g/dL (ref 13.0–17.0)
Lymphocytes Relative: 22 %
Lymphs Abs: 2.7 10*3/uL (ref 0.7–4.0)
MCH: 31.4 pg (ref 26.0–34.0)
MCHC: 33.5 g/dL (ref 30.0–36.0)
MCV: 93.9 fL (ref 78.0–100.0)
Monocytes Absolute: 0.7 10*3/uL (ref 0.1–1.0)
Monocytes Relative: 6 %
NEUTROS PCT: 70 %
Neutro Abs: 8.3 10*3/uL — ABNORMAL HIGH (ref 1.7–7.7)
Platelets: 185 10*3/uL (ref 150–400)
RBC: 5.06 MIL/uL (ref 4.22–5.81)
RDW: 13.9 % (ref 11.5–15.5)
WBC: 12 10*3/uL — ABNORMAL HIGH (ref 4.0–10.5)

## 2017-09-16 LAB — COMPREHENSIVE METABOLIC PANEL
ALK PHOS: 50 U/L (ref 38–126)
ALT: 92 U/L — ABNORMAL HIGH (ref 17–63)
AST: 91 U/L — ABNORMAL HIGH (ref 15–41)
Albumin: 3.8 g/dL (ref 3.5–5.0)
Anion gap: 12 (ref 5–15)
BILIRUBIN TOTAL: 1.9 mg/dL — AB (ref 0.3–1.2)
BUN: 23 mg/dL — ABNORMAL HIGH (ref 6–20)
CALCIUM: 9.2 mg/dL (ref 8.9–10.3)
CO2: 21 mmol/L — AB (ref 22–32)
Chloride: 104 mmol/L (ref 101–111)
Creatinine, Ser: 1.78 mg/dL — ABNORMAL HIGH (ref 0.61–1.24)
GFR calc non Af Amer: 36 mL/min — ABNORMAL LOW (ref >60)
GFR, EST AFRICAN AMERICAN: 42 mL/min — AB (ref >60)
Glucose, Bld: 212 mg/dL — ABNORMAL HIGH (ref 65–99)
Potassium: 4.9 mmol/L (ref 3.5–5.1)
SODIUM: 137 mmol/L (ref 135–145)
TOTAL PROTEIN: 6.8 g/dL (ref 6.5–8.1)

## 2017-09-16 LAB — CREATININE, SERUM
CREATININE: 1.64 mg/dL — AB (ref 0.61–1.24)
GFR calc Af Amer: 46 mL/min — ABNORMAL LOW (ref >60)
GFR, EST NON AFRICAN AMERICAN: 40 mL/min — AB (ref >60)

## 2017-09-16 LAB — TROPONIN I
TROPONIN I: 0.67 ng/mL — AB (ref <0.03)
Troponin I: 0.03 ng/mL (ref <0.03)

## 2017-09-16 LAB — PROTIME-INR
INR: 1.21
Prothrombin Time: 15.2 seconds (ref 11.4–15.2)

## 2017-09-16 LAB — PHOSPHORUS: Phosphorus: 4.4 mg/dL (ref 2.5–4.6)

## 2017-09-16 LAB — MAGNESIUM: MAGNESIUM: 2.1 mg/dL (ref 1.7–2.4)

## 2017-09-16 LAB — I-STAT TROPONIN, ED: Troponin i, poc: 0.03 ng/mL (ref 0.00–0.08)

## 2017-09-16 MED ORDER — NICOTINE 7 MG/24HR TD PT24
7.0000 mg | MEDICATED_PATCH | Freq: Every day | TRANSDERMAL | Status: DC
  Administered 2017-09-17 – 2017-09-19 (×3): 7 mg via TRANSDERMAL
  Filled 2017-09-16 (×6): qty 1

## 2017-09-16 MED ORDER — ONDANSETRON HCL 4 MG/2ML IJ SOLN
4.0000 mg | Freq: Once | INTRAMUSCULAR | Status: AC
  Administered 2017-09-16: 4 mg via INTRAVENOUS

## 2017-09-16 MED ORDER — CARVEDILOL 12.5 MG PO TABS
12.5000 mg | ORAL_TABLET | Freq: Every day | ORAL | Status: DC
  Administered 2017-09-17 – 2017-09-18 (×2): 12.5 mg via ORAL
  Filled 2017-09-16 (×3): qty 1

## 2017-09-16 MED ORDER — AMIODARONE HCL IN DEXTROSE 360-4.14 MG/200ML-% IV SOLN
30.0000 mg/h | INTRAVENOUS | Status: DC
  Administered 2017-09-17 – 2017-09-19 (×5): 30 mg/h via INTRAVENOUS
  Filled 2017-09-16 (×5): qty 200

## 2017-09-16 MED ORDER — ASPIRIN EC 325 MG PO TBEC
325.0000 mg | DELAYED_RELEASE_TABLET | Freq: Every day | ORAL | Status: DC
  Administered 2017-09-16 – 2017-09-17 (×2): 325 mg via ORAL
  Filled 2017-09-16 (×2): qty 1

## 2017-09-16 MED ORDER — ONDANSETRON HCL 4 MG/2ML IJ SOLN
INTRAMUSCULAR | Status: AC
  Filled 2017-09-16: qty 2

## 2017-09-16 MED ORDER — INSULIN ASPART 100 UNIT/ML ~~LOC~~ SOLN: 0.0000 [IU] | SUBCUTANEOUS | Status: DC

## 2017-09-16 MED ORDER — SODIUM CHLORIDE 0.9 % IV BOLUS
1000.0000 mL | Freq: Once | INTRAVENOUS | Status: AC
  Administered 2017-09-16: 1000 mL via INTRAVENOUS

## 2017-09-16 MED ORDER — FAMOTIDINE 20 MG PO TABS
20.0000 mg | ORAL_TABLET | Freq: Every day | ORAL | Status: DC
  Administered 2017-09-16 – 2017-09-18 (×3): 20 mg via ORAL
  Filled 2017-09-16 (×3): qty 1

## 2017-09-16 MED ORDER — ACETAMINOPHEN 650 MG RE SUPP: 650.0000 mg | Freq: Four times a day (QID) | RECTAL | Status: DC | PRN

## 2017-09-16 MED ORDER — HEPARIN (PORCINE) IN NACL 100-0.45 UNIT/ML-% IJ SOLN
1300.0000 [IU]/h | INTRAMUSCULAR | Status: DC
  Administered 2017-09-16 – 2017-09-17 (×2): 1300 [IU]/h via INTRAVENOUS
  Filled 2017-09-16 (×3): qty 250

## 2017-09-16 MED ORDER — HEPARIN SODIUM (PORCINE) 5000 UNIT/ML IJ SOLN
5000.0000 [IU] | Freq: Three times a day (TID) | INTRAMUSCULAR | Status: DC
  Administered 2017-09-16: 5000 [IU] via SUBCUTANEOUS
  Filled 2017-09-16: qty 1

## 2017-09-16 MED ORDER — ONDANSETRON HCL 4 MG/2ML IJ SOLN: 4.0000 mg | Freq: Four times a day (QID) | INTRAMUSCULAR | Status: DC | PRN

## 2017-09-16 MED ORDER — ONDANSETRON HCL 4 MG PO TABS: 4.0000 mg | ORAL_TABLET | Freq: Four times a day (QID) | ORAL | Status: DC | PRN

## 2017-09-16 MED ORDER — ETOMIDATE 2 MG/ML IV SOLN
INTRAVENOUS | Status: AC | PRN
  Administered 2017-09-16 (×2): 5 mg via INTRAVENOUS

## 2017-09-16 MED ORDER — LACTATED RINGERS IV SOLN
INTRAVENOUS | Status: AC
  Administered 2017-09-17: 01:00:00 via INTRAVENOUS

## 2017-09-16 MED ORDER — ETOMIDATE 2 MG/ML IV SOLN
5.0000 mg | Freq: Once | INTRAVENOUS | Status: DC
  Filled 2017-09-16: qty 10

## 2017-09-16 MED ORDER — ACETAMINOPHEN 325 MG PO TABS: 650.0000 mg | ORAL_TABLET | Freq: Four times a day (QID) | ORAL | Status: DC | PRN

## 2017-09-16 MED ORDER — AMIODARONE HCL IN DEXTROSE 360-4.14 MG/200ML-% IV SOLN
60.0000 mg/h | INTRAVENOUS | Status: AC
  Administered 2017-09-16: 60 mg/h via INTRAVENOUS
  Filled 2017-09-16: qty 200

## 2017-09-16 NOTE — Sedation Documentation (Signed)
Pt very drowsy, but still able to talk and answer questions.

## 2017-09-16 NOTE — ED Notes (Signed)
Pt's monitor currently shows irregular wide complex tachycardia.

## 2017-09-16 NOTE — Progress Notes (Signed)
ANTICOAGULATION CONSULT NOTE - Initial Consult  Pharmacy Consult for Heparin Indication: chest pain/ACS  Allergies  Allergen Reactions  . Sulfonamide Derivatives     Patient Measurements: Height:  (188 cm) Weight: 218 lb (98.9 kg) IBW/kg (Calculated) : 82.2 HEPARIN DW (KG): 98.9   Vital Signs: BP: 107/80 (05/26 2200) Pulse Rate: 87 (05/26 2200)  Labs: Recent Labs    09/16/17 1647 09/16/17 2125  HGB 15.9 14.0  HCT 47.5 43.5  PLT 185 150  CREATININE 1.78* 1.64*  TROPONINI 0.03* 0.67*    Estimated Creatinine Clearance: 49.7 mL/min (A) (by C-G formula based on SCr of 1.64 mg/dL (H)).   Medical History: Past Medical History:  Diagnosis Date  . MI (myocardial infarction) (HCC)     Medications:   (Not in a hospital admission)  Reviewed  Assessment: Okay for Protocol, SQ Heparin 5000 units given.  Baseline anticoag levels pending.  Goal of Therapy:  Heparin level 0.3-0.7 units/ml Monitor platelets by anticoagulation protocol: Yes   Plan:  No Bolus Start heparin infusion at 1300 units/hr Check anti-Xa level in 6-8 hours and daily while on heparin Continue to monitor H&H and platelets  Harold English 09/16/2017,10:14 PM

## 2017-09-16 NOTE — H&P (Addendum)
History and Physical    Harold English JXB:[id number] DOB: 1940-12-20 DOA: 09/16/2017  PCP: No primary care provider on file.   Patient coming from: Home  Chief Complaint: Chest pressure/SOB  HPI: Harold English is a 77 y.o. male with medical history significant for hypertension, dyslipidemia, CAD with prior MI in 2004 and prior CABG over 10 years ago, ischemic cardiomyopathy with last LVEF 25 to 30% noted on 10/2009, and ongoing tobacco abuse who presented to the emergency department via EMS after he began feeling lightheaded and short of breath with some chest pressure as he was working outside today.  Upon EMS arrival he was noted to have a wide-complex tachyarrhythmia with a rate over 200 for which he was given amiodarone.  His rate decreased to 150 bpm and was noted to be irregular with an appearance of atrial fibrillation with RVR.  He was somewhat unstable in the emergency department and received a synchronized shock of 150 J and has now converted to sinus rhythm.  He currently denies any further symptomatology and remains in sinus rhythm with heart rate in the 80 to 90 bpm range.  Patient has not seen a cardiologist in the last 5 years.   ED Course: Vital signs are currently stable with sinus rhythm and 80 to 90 bpm range with some soft blood pressure readings.  He is otherwise asymptomatic.  Laboratory data with some mild liver enzyme elevations with AST 91 and ALT 92.  Troponin of 0.03, mild leukocytosis of 12,000, and a creatinine of 1.78 with no prior comparison.  Review of Systems: All others reviewed and otherwise negative.  Past Medical History:  Diagnosis Date  . MI (myocardial infarction) [facility])     Past Surgical History:  Procedure Laterality Date  . ADENOIDECTOMY    . BACK SURGERY    . CARDIAC SURGERY    . CORONARY ARTERY BYPASS GRAFT    . TONSILLECTOMY       reports that he has been smoking cigarettes.  He has a 39.00 pack-year smoking history. He has never used  smokeless tobacco. He reports that he drinks alcohol. He reports that he does not use drugs.  Allergies  Allergen Reactions  . Sulfonamide Derivatives     History reviewed. No pertinent family history.  Prior to Admission medications   Medication Sig Start Date End Date Taking? Authorizing Provider  aspirin EC 325 MG tablet Take 325 mg by mouth at bedtime.    Yes [provider]  atorvastatin (LIPITOR) 40 MG tablet Take 40 mg by mouth at bedtime.  08/31/13  Yes [provider]  carvedilol (COREG) 12.5 MG tablet Take 12.5 mg by mouth at bedtime.  08/31/13  Yes [provider]  famotidine (PEPCID) 20 MG tablet Take 20 mg by mouth at bedtime.   Yes [provider]  spironolactone (ALDACTONE) 25 MG tablet Take 25 mg by mouth at bedtime.  08/31/13  Yes [provider]    Physical Exam: Vitals:   09/16/17 1925 09/16/17 1935 09/16/17 1940 09/16/17 1947  BP: 101/74 98/72 108/68 109/81  Pulse: 90 91 89 88  Resp: SpO2: 97% 98% 98% 96%  Weight:      Height:        Constitutional: NAD, calm, comfortable Vitals:   09/16/17 1925 09/16/17 1935 09/16/17 1940 09/16/17 1947  BP: 101/74 98/72 108/68 109/81  Pulse: 90 91 89 88  Resp: SpO2: 97% 98% 98% 96%  Weight:      Height:       Eyes: lids and conjunctivae normal ENMT: Mucous membranes are moist.  Neck: normal, supple Respiratory: clear to auscultation bilaterally. Normal respiratory effort. No accessory muscle use. Currently on 2L [place]. Cardiovascular: Regular rate and rhythm, no murmurs. No extremity edema. Abdomen: no tenderness, no distention. Bowel sounds positive.  Musculoskeletal:  No joint deformity upper and lower extremities.   Skin: no rashes, lesions, ulcers.  Psychiatric: Normal judgment and insight. Alert and oriented x 3. Normal mood.   Labs on Admission: I have personally reviewed following labs and imaging studies  CBC: Recent Labs  Lab  09/16/17 1647  WBC 12.0*  NEUTROABS 8.3*  HGB 15.9  HCT 47.5  MCV 93.9  PLT 185   Basic Metabolic Panel: Recent Labs  Lab 09/16/17 1647  NA 137  K 4.9  CL 104  CO2 21*  GLUCOSE 212*  BUN 23*  CREATININE 1.78*  CALCIUM 9.2  MG 2.1  PHOS 4.4   GFR: Estimated Creatinine Clearance: 45.8 mL/min (A) (by C-G formula based on SCr of 1.78 mg/dL (H)). Liver Function Tests: Recent Labs  Lab 09/16/17 1647  AST 91*  ALT 92*  ALKPHOS 50  BILITOT 1.9*  PROT 6.8  ALBUMIN 3.8   No results for input(s): LIPASE, AMYLASE in the last 168 hours. No results for input(s): AMMONIA in the last 168 hours. Coagulation Profile: No results for input(s): INR, PROTIME in the last 168 hours. Cardiac Enzymes: Recent Labs  Lab 09/16/17 1647  TROPONINI 0.03*   BNP (last 3 results) No results for input(s): PROBNP in the last 8760 hours. HbA1C: No results for input(s): HGBA1C in the last 72 hours. CBG: No results for input(s): GLUCAP in the last 168 hours. Lipid Profile: No results for input(s): CHOL, HDL, LDLCALC, TRIG, CHOLHDL, LDLDIRECT in the last 72 hours. Thyroid Function Tests: No results for input(s): TSH, T4TOTAL, FREET4, T3FREE, THYROIDAB in the last 72 hours. Anemia Panel: No results for input(s): VITAMINB12, FOLATE, FERRITIN, TIBC, IRON, RETICCTPCT in the last 72 hours. Urine analysis: No results found for: COLORURINE, APPEARANCEUR, LABSPEC, PHURINE, GLUCOSEU, HGBUR, BILIRUBINUR, KETONESUR, PROTEINUR, UROBILINOGEN, NITRITE, LEUKOCYTESUR  Radiological Exams on Admission: Dg Chest Portable 1 View  Result Date: 09/16/2017 CLINICAL DATA:  Tachycardia and chest pain EXAM: PORTABLE CHEST 1 VIEW COMPARISON:  Sep 06, 2013 FINDINGS: There is no edema or consolidation. Heart is upper normal in size with pulmonary vascularity normal. Patient is status post coronary artery bypass grafting. No pneumothorax. No adenopathy. No bone lesions. IMPRESSION: No edema or consolidation.  Heart upper  normal in size. Electronically Signed   By: Bretta Bang III M.D.   On: 09/16/2017 17:10    EKG: Independently reviewed. SR 95bpm with RBBB.  Assessment/Plan Principal Problem:   Ventricular tachyarrhythmia (HCC) Active Problems:   Mixed hyperlipidemia   TOBACCO ABUSE   Essential hypertension, benign   CORONARY ATHEROSCLEROSIS NATIVE CORONARY ARTERY   Chest pain    1. Symptomatic ventricular tachyarrhythmia in the setting of ischemic cardiomyopathy.  Prior LVEF of 25 to 30% in 2011.  Discussed case with Dr. Allena Earing of cardiology at [facility] who agrees with amiodarone drip and transfer to stepdown unit with telemetry monitoring and trending of troponins.  Continue full dose aspirin and carvedilol with repeat EKG in a.m.  Magnesium and potassium noted to be within normal limits.  Order repeat 2D echo.  Hold Spironolactone. 2. AKI versus CKD stage III.  Maintain on gentle IV fluids  and avoid nephrotoxic agents.  Renal ultrasound for further evaluation.  Repeat renal panel in a.m. 3. Elevated troponin. Started on heparin drip. Will continue to trend. 4. Mild transaminitis.  This is likely secondary to mild liver shock related to above events.  Will withhold statin for now and monitor with repeat CMP in a.m. 5. Hyperglycemia. Check A1c and place on SSI for now. 6. CAD with prior CABG. Hold statin for now; continue with ASA and B-blocker. 7. GERD.  PPI. 8. Tobacco abuse.  Counseled on cessation; will provide nicotine patch.   DVT prophylaxis: Heparin drip. Code Status: Full Family Communication: None at bedside Disposition Plan:Transfer to [facility] for Cardiology evaluation Consults called:Cardiology Dr. Allena Earing Admission status: Inpatient, SDU  Addendum: Repeat troponin is currently 0.67 and patient is still asymptomatic.  We will add heparin drip at this time and discontinue heparin [place].   Tyeesha Riker Hoover Brunette DO [facility] Pager [phone]  If 7PM-7AM, please contact  night-coverage [email or web address] Password [email or web address]  09/16/2017, 8:30 PM

## 2017-09-16 NOTE — ED Notes (Signed)
Pt converted back to sinus rhythm at this time.

## 2017-09-16 NOTE — ED Notes (Signed)
Signature pad will not reach to bed for pt to sign.  2 nurse verbal consent obtained.

## 2017-09-16 NOTE — ED Provider Notes (Addendum)
[facility] EMERGENCY DEPARTMENT Provider Note   CSN: [id number] Arrival date & time: 09/16/17  1634     History   Chief Complaint Chief Complaint  Patient presents with  . Tachycardia    HPI Harold English is a 77 y.o. male.  HPI Patient states he was outside working day and began to feel lightheaded shortness of breath with some chest pain.  He called EMS and was found to be likely ventricular tachycardia.  He was wide-complex with a rate of over 200.  Given amiodarone by EMS and heart rate decreased to around 150 and became irregular.  Patient states he feels much better now.  No chest pain now.  Has previous history of CABG around 10 years ago.  No history of atrial fibrillation.  Has been doing well the last couple days. Past Medical History:  Diagnosis Date  . MI (myocardial infarction) [facility])     Patient Active Problem List   Diagnosis Date Noted  . ISCHEMIC CARDIOMYOPATHY 01/10/2010  . MIXED HYPERLIPIDEMIA 11/17/2009  . TOBACCO ABUSE 11/17/2009  . ESSENTIAL HYPERTENSION, BENIGN 11/17/2009  . CORONARY ATHEROSCLEROSIS NATIVE CORONARY ARTERY 11/17/2009  . UNSPECIFIED SECONDARY CARDIOMYOPATHY 11/17/2009  . SHOULDER PAIN 06/07/2009  . IMPINGEMENT SYNDROME 06/07/2009    Past Surgical History:  Procedure Laterality Date  . ADENOIDECTOMY    . BACK SURGERY    . CARDIAC SURGERY    . CORONARY ARTERY BYPASS GRAFT    . TONSILLECTOMY          Home Medications    Prior to Admission medications   Medication Sig Start Date End Date Taking? Authorizing Provider  aspirin EC 81 MG tablet Take 81 mg by mouth at bedtime.     [provider]  atorvastatin (LIPITOR) 40 MG tablet Take 40 mg by mouth at bedtime.  08/31/13   [provider]  azithromycin (ZITHROMAX) 250 MG tablet Take 1 tablet (250 mg total) by mouth daily. Take one tablet daily for 4 days,  Starting 09/07/13 09/06/13   Burgess Amor, PA-C  benzonatate (TESSALON) 100 MG capsule Take 2 capsules (200  mg total) by mouth every 8 (eight) hours. 09/06/13   Burgess Amor, PA-C  carvedilol (COREG) 12.5 MG tablet Take 12.5 mg by mouth at bedtime.  08/31/13   [provider]  predniSONE (DELTASONE) 20 MG tablet Take 3 tablets (60 mg total) by mouth daily. 09/06/13   Burgess Amor, PA-C  spironolactone (ALDACTONE) 25 MG tablet Take 25 mg by mouth at bedtime.  08/31/13   [provider]    Family History History reviewed. No pertinent family history.  Social History Social History   Tobacco Use  . Smoking status: Current Every Day Smoker    Packs/day: 0.75    Years: 52.00    Pack years: 39.00    Types: Cigarettes  . Smokeless tobacco: Never Used  Substance Use Topics  . Alcohol use: Yes    Comment: 1 drink every 3 days   . Drug use: No     Allergies   Sulfonamide derivatives   Review of Systems Review of Systems  Constitutional: Negative for appetite change.  HENT: Negative for congestion.   Eyes: Negative for visual disturbance.  Respiratory: Positive for shortness of breath.   Cardiovascular: Positive for chest pain and palpitations.  Gastrointestinal: Negative for abdominal pain.  Endocrine: Negative for polyuria.  Genitourinary: Negative for dysuria.  Musculoskeletal: Negative for back pain.  Skin: Negative for rash.  Neurological: Positive for light-headedness.  Hematological: Negative for adenopathy.  Psychiatric/Behavioral: Negative for confusion.     Physical Exam Updated Vital Signs BP 108/85   Pulse 96   Resp 19   Ht  (1.88 m)   Wt 98.9 kg (218 lb)   SpO2 97%   BMI 27.99 kg/m   Physical Exam  Constitutional: He appears well-developed.  HENT:  Head: Normocephalic.  Eyes: EOM are normal.  Neck: Neck supple.  Cardiovascular:  Irregular tachycardia.  Scar on chest from previous median sternotomy.  Pulmonary/Chest: Effort normal. He has no rales.  Abdominal: Soft. There is no tenderness.  Musculoskeletal: He exhibits no edema.    Neurological: He is alert.  Skin: Skin is warm. Capillary refill takes less than 2 seconds.     ED Treatments / Results  Labs (all labs ordered are listed, but only abnormal results are displayed) Labs Reviewed  COMPREHENSIVE METABOLIC PANEL - Abnormal; Notable for the following components:      Result Value   CO2 21 (*)    Glucose, Bld 212 (*)    BUN 23 (*)    Creatinine, Ser 1.78 (*)    AST 91 (*)    ALT 92 (*)    Total Bilirubin 1.9 (*)    GFR calc non Af Amer 36 (*)    GFR calc Af Amer 42 (*)    All other components within normal limits  CBC WITH DIFFERENTIAL/PLATELET - Abnormal; Notable for the following components:   WBC 12.0 (*)    Neutro Abs 8.3 (*)    All other components within normal limits  TROPONIN I - Abnormal; Notable for the following components:   Troponin I 0.03 (*)    All other components within normal limits  MAGNESIUM  PHOSPHORUS  I-STAT TROPONIN, ED    EKG EKG Interpretation  Date/Time:  Sunday Sep 16 2017 16:36:55 EDT Ventricular Rate:  148 PR Interval:    QRS Duration: 162 QT Interval:  343 QTC Calculation: 539 R Axis:   -117 Text Interpretation:  Extreme tachycardia with wide complex, no further rhythm analysis attempted Confirmed by Benjiman Core [id number]) on 09/16/2017 4:41:37 PM  EKG Interpretation  Date/Time:  Sunday Sep 16 2017 18:54:33 EDT Ventricular Rate:  95 PR Interval:    QRS Duration: 168 QT Interval:  402 QTC Calculation: 506 R Axis:   -126 Text Interpretation:  Sinus rhythm Multiple ventricular premature complexes Probable left atrial enlargement Right bundle branch block Baseline wander in lead(s) V1 V3 Confirmed by Benjiman Core [phone]) on 09/16/2017 7:03:25 PM        Radiology Dg Chest Portable 1 View  Result Date: 09/16/2017 CLINICAL DATA:  Tachycardia and chest pain EXAM: PORTABLE CHEST 1 VIEW COMPARISON:  Sep 06, 2013 FINDINGS: There is no edema or consolidation. Heart is upper normal in size with  pulmonary vascularity normal. Patient is status post coronary artery bypass grafting. No pneumothorax. No adenopathy. No bone lesions. IMPRESSION: No edema or consolidation.  Heart upper normal in size. Electronically Signed   By: Bretta Bang III M.D.   On: 09/16/2017 17:10    Procedures Sedation procedure Date/Time: 09/16/2017 7:09 PM Performed by: Benjiman Core, MD Authorized by: Benjiman Core, MD   Consent:    Consent obtained:  Written and verbal   Consent given by:  Patient   Risks discussed:  Allergic reaction, dysrhythmia, nausea, inadequate sedation, respiratory compromise necessitating ventilatory assistance and intubation, vomiting and prolonged hypoxia resulting in organ damage Universal protocol:    Immediately prior to  procedure a time out was called: yes     Patient identity confirmation method:  Arm band and verbally with patient Indications:    Procedure performed:  Cardioversion   Procedure necessitating sedation performed by:  Physician performing sedation   Intended level of sedation:  Moderate (conscious sedation) Pre-sedation assessment:    Time since last food or drink:  7 hrs   ASA classification: class 3 - patient with severe systemic disease     Neck mobility: normal     Mallampati score:  III - soft palate, base of uvula visible   Pre-sedation assessments completed and reviewed: airway patency and cardiovascular function   Immediate pre-procedure details:    Reassessment: Patient reassessed immediately prior to procedure     Reviewed: vital signs, relevant labs/tests and NPO status     Verified: bag valve mask available, emergency equipment available, intubation equipment available, IV patency confirmed, oxygen available and suction available   Procedure details (see MAR for exact dosages):    Preoxygenation:  Nasal cannula   Sedation:  Etomidate   Intra-procedure monitoring:  Blood pressure monitoring, cardiac monitor, continuous capnometry,  continuous pulse oximetry, frequent LOC assessments and frequent vital sign checks   Intra-procedure events: airway compromise     Intra-procedure management:  Airway repositioning   Total Provider sedation time (minutes):  10 Post-procedure details:    Attendance: Constant attendance by certified staff until patient recovered     Recovery: Patient returned to pre-procedure baseline     Post-sedation assessments completed and reviewed: nausea/vomiting     Patient is stable for discharge or admission: yes     Patient tolerance:  Tolerated well, no immediate complications .Cardioversion Date/Time: 09/16/2017 7:10 PM Performed by: Benjiman Core, MD Authorized by: Benjiman Core, MD   Consent:    Consent obtained:  Written   Consent given by:  Patient   Risks discussed:  Cutaneous burn, death, induced arrhythmia and pain   Alternatives discussed:  Rate-control medication and alternative treatment Pre-procedure details:    Cardioversion basis:  Elective   Rhythm:  Atrial fibrillation   Electrode placement:  Anterior-posterior Patient sedated: Yes. Refer to sedation procedure documentation for details of sedation.  Attempt one:    Cardioversion mode:  Synchronous   Waveform:  Biphasic   Shock (Joules):  150   Shock outcome:  Conversion to normal sinus rhythm Post-procedure details:    Patient status:  Awake   Patient tolerance of procedure:  Tolerated well, no immediate complications   (including critical care time) CRITICAL CARE Performed by: Benjiman Core Total critical care time: 30 minutes Critical care time was exclusive of separately billable procedures and treating other patients. Critical care was necessary to treat or prevent imminent or life-threatening deterioration. Critical care was time spent personally by me on the following activities: development of treatment plan with patient and/or surrogate as well as nursing, discussions with consultants, evaluation of  patient's response to treatment, examination of patient, obtaining history from patient or surrogate, ordering and performing treatments and interventions, ordering and review of laboratory studies, ordering and review of radiographic studies, pulse oximetry and re-evaluation of patient's condition.  Medications Ordered in ED Medications  etomidate (AMIDATE) injection 5 mg (5 mg Intravenous Not Given 09/16/17 1843)  ondansetron (ZOFRAN) injection 4 mg (has no administration in time range)  sodium chloride 0.9 % bolus 1,000 mL (0 mLs Intravenous Stopped 09/16/17 1810)  etomidate (AMIDATE) injection (5 mg Intravenous Given 09/16/17 1835)     Initial Impression /  Assessment and Plan / ED Course  I have reviewed the triage vital signs and the nursing notes.  Pertinent labs & imaging results that were available during my care of the patient were reviewed by me and considered in my medical decision making (see chart for details).    Chadsvasc 3(age,htn,vasc) Patient with V. fib prior to arrival.  Now converted to atrial fibrillation.  Onset was today.  Successfully cardioverted in the ER.  Will require anticoagulation.  Renal insufficiency but unsure of baseline.  With the ventricular tachycardia will require admission to the hospital.  May require admission to [facility] since I do not think cardiology will be here on the holiday tomorrow.  Will admit to hospitalist.  Final Clinical Impressions(s) / ED Diagnoses   Final diagnoses:  Ventricular tachycardia [facility])  Atrial fibrillation with RVR [facility])  Renal insufficiency    ED Discharge Orders    None       Benjiman Core, MD 09/16/17 1911    Benjiman Core, MD 10/02/17 1021

## 2017-09-16 NOTE — ED Notes (Signed)
CRITICAL VALUE ALERT  Critical Value:  Troponin 0.67  Date & Time Notied:  2210 09/16/2017  Provider Notified: [email or web address]  Orders Received/Actions taken: heparin ordered

## 2017-09-16 NOTE — ED Notes (Signed)
CRITICAL VALUE ALERT  Critical Value:  Troponin 0.03  Date & Time Notied:  09/16/2017 at 1750  Provider Notified: Dr. Rubin Payor   Orders Received/Actions taken:

## 2017-09-16 NOTE — Sedation Documentation (Signed)
Synchronized shock delivered at 150J.  Pt converted to bigimeny.

## 2017-09-16 NOTE — ED Notes (Signed)
Per EMS  Amiodarone 150 mg mixed in 100  Cc D5W infusing.

## 2017-09-16 NOTE — ED Triage Notes (Signed)
EMS called out for chest pain.    Pt V-tach 220 rate when EMS arrived.  EMS gave  Adenosine stacked  10-04-10 mg with no response.  Tried vagal maneuvers.  Started amiodarone 150 mg.  Denies any pain at this time.

## 2017-09-17 ENCOUNTER — Other Ambulatory Visit (HOSPITAL_COMMUNITY): Payer: Medicare Other

## 2017-09-17 ENCOUNTER — Inpatient Hospital Stay (HOSPITAL_COMMUNITY): Payer: Medicare Other

## 2017-09-17 ENCOUNTER — Encounter (HOSPITAL_COMMUNITY): Payer: Self-pay | Admitting: Emergency Medicine

## 2017-09-17 ENCOUNTER — Other Ambulatory Visit: Payer: Self-pay

## 2017-09-17 DIAGNOSIS — N183 Chronic kidney disease, stage 3 (moderate): Secondary | ICD-10-CM

## 2017-09-17 DIAGNOSIS — I480 Paroxysmal atrial fibrillation: Secondary | ICD-10-CM

## 2017-09-17 DIAGNOSIS — I214 Non-ST elevation (NSTEMI) myocardial infarction: Principal | ICD-10-CM

## 2017-09-17 DIAGNOSIS — N170 Acute kidney failure with tubular necrosis: Secondary | ICD-10-CM

## 2017-09-17 LAB — COMPREHENSIVE METABOLIC PANEL
ALBUMIN: 3.2 g/dL — AB (ref 3.5–5.0)
ALT: 70 U/L — ABNORMAL HIGH (ref 17–63)
ANION GAP: 7 (ref 5–15)
AST: 49 U/L — ABNORMAL HIGH (ref 15–41)
Alkaline Phosphatase: 45 U/L (ref 38–126)
BUN: 22 mg/dL — ABNORMAL HIGH (ref 6–20)
CO2: 25 mmol/L (ref 22–32)
Calcium: 8.4 mg/dL — ABNORMAL LOW (ref 8.9–10.3)
Chloride: 107 mmol/L (ref 101–111)
Creatinine, Ser: 1.54 mg/dL — ABNORMAL HIGH (ref 0.61–1.24)
GFR calc Af Amer: 50 mL/min — ABNORMAL LOW (ref >60)
GFR calc non Af Amer: 43 mL/min — ABNORMAL LOW (ref >60)
GLUCOSE: 96 mg/dL (ref 65–99)
POTASSIUM: 4.4 mmol/L (ref 3.5–5.1)
SODIUM: 139 mmol/L (ref 135–145)
Total Bilirubin: 1.1 mg/dL (ref 0.3–1.2)
Total Protein: 5.6 g/dL — ABNORMAL LOW (ref 6.5–8.1)

## 2017-09-17 LAB — GLUCOSE, CAPILLARY
GLUCOSE-CAPILLARY: 120 mg/dL — AB (ref 65–99)
GLUCOSE-CAPILLARY: 125 mg/dL — AB (ref 65–99)
Glucose-Capillary: 102 mg/dL — ABNORMAL HIGH (ref 65–99)

## 2017-09-17 LAB — HEMOGLOBIN A1C
HEMOGLOBIN A1C: 5.5 % (ref 4.8–5.6)
MEAN PLASMA GLUCOSE: 111.15 mg/dL

## 2017-09-17 LAB — CBC
HEMATOCRIT: 41 % (ref 39.0–52.0)
HEMOGLOBIN: 13.3 g/dL (ref 13.0–17.0)
MCH: 30.6 pg (ref 26.0–34.0)
MCHC: 32.4 g/dL (ref 30.0–36.0)
MCV: 94.3 fL (ref 78.0–100.0)
Platelets: 140 10*3/uL — ABNORMAL LOW (ref 150–400)
RBC: 4.35 MIL/uL (ref 4.22–5.81)
RDW: 14 % (ref 11.5–15.5)
WBC: 10.4 10*3/uL (ref 4.0–10.5)

## 2017-09-17 LAB — TROPONIN I
TROPONIN I: 1.3 ng/mL — AB (ref <0.03)
Troponin I: 1.17 ng/mL (ref <0.03)

## 2017-09-17 LAB — HEPARIN LEVEL (UNFRACTIONATED): Heparin Unfractionated: 0.54 IU/mL (ref 0.30–0.70)

## 2017-09-17 LAB — CBG MONITORING, ED
GLUCOSE-CAPILLARY: 97 mg/dL (ref 65–99)
Glucose-Capillary: 97 mg/dL (ref 65–99)
Glucose-Capillary: 89 mg/dL (ref 65–99)

## 2017-09-17 LAB — MRSA PCR SCREENING: MRSA by PCR: NEGATIVE

## 2017-09-17 MED ORDER — ROSUVASTATIN CALCIUM 20 MG PO TABS
20.0000 mg | ORAL_TABLET | Freq: Every day | ORAL | Status: DC
  Administered 2017-09-17 – 2017-09-18 (×2): 20 mg via ORAL
  Filled 2017-09-17 (×2): qty 1

## 2017-09-17 MED ORDER — SODIUM CHLORIDE 0.9% FLUSH: 3.0000 mL | INTRAVENOUS | Status: DC | PRN

## 2017-09-17 MED ORDER — ASPIRIN 81 MG PO CHEW
81.0000 mg | CHEWABLE_TABLET | ORAL | Status: AC
  Administered 2017-09-18: 81 mg via ORAL
  Filled 2017-09-17: qty 1

## 2017-09-17 MED ORDER — SODIUM CHLORIDE 0.9 % WEIGHT BASED INFUSION
1.0000 mL/kg/h | INTRAVENOUS | Status: DC
  Administered 2017-09-17 – 2017-09-18 (×2): 1 mL/kg/h via INTRAVENOUS

## 2017-09-17 MED ORDER — SODIUM CHLORIDE 0.9 % IV SOLN: 250.0000 mL | INTRAVENOUS | Status: DC | PRN

## 2017-09-17 MED ORDER — LACTATED RINGERS IV SOLN
INTRAVENOUS | Status: DC
  Administered 2017-09-17: 11:00:00 via INTRAVENOUS

## 2017-09-17 MED ORDER — SODIUM CHLORIDE 0.9% FLUSH
3.0000 mL | Freq: Two times a day (BID) | INTRAVENOUS | Status: DC
  Administered 2017-09-18: 3 mL via INTRAVENOUS

## 2017-09-17 NOTE — ED Notes (Signed)
US in progress

## 2017-09-17 NOTE — ED Notes (Signed)
Second page to Dr Gonzella Lex.

## 2017-09-17 NOTE — Consult Note (Signed)
Cardiology Consultation:   Patient ID: Harold English; [id number]; 29-Apr-1940   Admit date: 09/16/2017 Date of Consult: 09/17/2017  Primary Cardiologist: Dr Diona Browner    Patient Profile:   Harold English is a 77 y.o. male with a hx of coronary artery disease s/p CABG, ischemic cardiomyopathy, hypertension, hyperlipidemia who is being seen today for the evaluation of paroxysmal atrial fibrillation and non-ST elevation myocardial infarction at the request of Rickey Barbara MD.  History of Present Illness:   Patient has not been seen since 2011.  He apparently has had coronary artery bypass and graft in 2004 in [place].  Last echocardiogram July 2011 showed ejection fraction 25 to 30% and mild mitral regurgitation.  Nuclear study September 2011 showed ejection fraction 23%.  There was prior anterior, septal, apical and inferior infarct but no ischemia.  Yesterday the patient was moving items in the heat and developed chest pain.  It was described as an aching sensation without radiation.  There was diaphoresis but no nausea or dyspnea.  He denied palpitations.  He sat down in his truck and eventually drove home.  His symptoms did not resolve and he was eventually taken to [facility].  He was found to be in atrial fibrillation with a rapid ventricular response.  He was placed on amiodarone and ultimately cardioverted and has had no symptoms since.  He has ruled in and was transferred for further evaluation.  He denies chest pain at present.  Past Medical History:  Diagnosis Date  . CAD (coronary artery disease)   . GERD (gastroesophageal reflux disease)   . Hyperlipidemia   . Hypertension   . Ischemic cardiomyopathy   . MI (myocardial infarction) (HCC)   . Renal insufficiency     Past Surgical History:  Procedure Laterality Date  . ADENOIDECTOMY    . BACK SURGERY    . CARDIAC SURGERY    . CORONARY ARTERY BYPASS GRAFT    . TONSILLECTOMY        Inpatient  Medications: Scheduled Meds: . aspirin EC  325 mg Oral QHS  . carvedilol  12.5 mg Oral QHS  . famotidine  20 mg Oral QHS  . insulin aspart  0-9 Units Subcutaneous Q4H  . nicotine  7 mg Transdermal Daily   Continuous Infusions: . amiodarone 30 mg/hr (09/17/17 0757)  . heparin 1,300 Units/hr (09/16/17 2243)  . lactated ringers 100 mL/hr at 09/17/17 1039   PRN Meds: acetaminophen **OR** acetaminophen, ondansetron **OR** ondansetron (ZOFRAN) IV  Allergies:    Allergies  Allergen Reactions  . Sulfonamide Derivatives     Social History:   Social History   Socioeconomic History  . Marital status: Married    Spouse name: Not on file  . Number of children: Not on file  . Years of education: Not on file  . Highest education level: Not on file  Occupational History    Comment: Retired  Engineer, production  . Financial resource strain: Not on file  . Food insecurity:    Worry: Not on file    Inability: Not on file  . Transportation needs:    Medical: Not on file    Non-medical: Not on file  Tobacco Use  . Smoking status: Current Every Day Smoker    Packs/day: 0.75    Years: 0.00    Pack years: 0.00    Types: Cigarettes  . Smokeless tobacco: Never Used  Substance and Sexual Activity  . Alcohol use: Yes    Comment: 1  drink every 3 days   . Drug use: No  . Sexual activity: Not on file  Lifestyle  . Physical activity:    Days per week: Not on file    Minutes per session: Not on file  . Stress: Not on file  Relationships  . Social connections:    Talks on phone: Not on file    Gets together: Not on file    Attends religious service: Not on file    Active member of club or organization: Not on file    Attends meetings of clubs or organizations: Not on file    Relationship status: Not on file  . Intimate partner violence:    Fear of current or ex partner: Not on file    Emotionally abused: Not on file    Physically abused: Not on file    Forced sexual activity: Not on file   Other Topics Concern  . Not on file  Social History Narrative  . Not on file    Family History:    Family History  Problem Relation Age of Onset  . CAD Father      ROS:  Please see the history of present illness.  Patient denies fevers, chills, productive cough, hemoptysis, dysphagia, hematochezia.  He is under stress related to his wife's illness. All other ROS reviewed and negative.     Physical Exam/Data:   Vitals:   09/17/17 1015 09/17/17 1030 09/17/17 1155 09/17/17 1200  BP: 102/70 99/72 107/74   Pulse: 82 78 87   Resp: Temp:   98.6 F (37 C)   TempSrc:   Oral   SpO2: 96% 95% (!) 85%   Weight:    214 lb 8.1 oz (97.3 kg)  Height:     (1.88 m)    Intake/Output Summary (Last 24 hours) at 09/17/2017 1414 Last data filed at 09/17/2017 1300 Gross per 24 hour  Intake 2259.4 ml  Output 300 ml  Net 1959.4 ml   Filed Weights   09/16/17 1639 09/17/17 1200  Weight: 218 lb (98.9 kg) 214 lb 8.1 oz (97.3 kg)   Body mass index is 27.54 kg/m.  General:  Well nourished, well developed, in no acute distress HEENT: normal Lymph: no adenopathy Neck: no JVD Vascular: No carotid bruits; FA pulses 2+ bilaterally without bruits  Cardiac: Distant heart sounds normal S1, S2; RRR; no murmur  Lungs: Diminished breath sounds throughout. Abd: soft, nontender, no hepatomegaly  Ext: no edema, diminished dorsalis pedis pulses. Musculoskeletal:  No deformities, BUE and BLE strength normal and equal Skin: warm and dry  Neuro:  CNs 2-12 intact, no focal abnormalities noted Psych:  Normal affect   EKG:  The EKG was personally reviewed and demonstrates: Sinus rhythm with PVCs, right bundle branch block, anterior infarct. Telemetry:  Telemetry was personally reviewed and demonstrates: Sinus with PVCs.   Laboratory Data:  Chemistry Recent Labs  Lab 09/16/17 1647 09/16/17 2125 09/17/17 0521  NA 137  --  139  K 4.9  --  4.4  CL 104  --  107  CO2 21*  --  25   GLUCOSE 212*  --  96  BUN 23*  --  22*  CREATININE 1.78* 1.64* 1.54*  CALCIUM 9.2  --  8.4*  GFRNONAA 36* 40* 43*  GFRAA 42* 46* 50*  ANIONGAP 12  --  7    Recent Labs  Lab 09/16/17 1647 09/17/17 0521  PROT 6.8 5.6*  ALBUMIN 3.8  3.2*  AST 91* 49*  ALT 92* 70*  ALKPHOS 50 45  BILITOT 1.9* 1.1   Hematology Recent Labs  Lab 09/16/17 1647 09/16/17 2125 10-09-17 0521  WBC 12.0* 10.6* 10.4  RBC 5.06 4.62 4.35  HGB 15.9 14.0 13.3  HCT 47.5 43.5 41.0  MCV 93.9 94.2 94.3  MCH 31.4 30.3 30.6  MCHC 33.5 32.2 32.4  RDW 13.9 13.9 14.0  PLT 185 150 140*   Cardiac Enzymes Recent Labs  Lab 09/16/17 1647 09/16/17 2125 Oct 09, 2017 0246 Oct 09, 2017 0833  TROPONINI 0.03* 0.67* 1.30* 1.17*    Recent Labs  Lab 09/16/17 1644  TROPIPOC 0.03   Radiology/Studies:  US Renal  Result Date: Oct 09, 2017 CLINICAL DATA:  Chronic renal disease EXAM: RENAL / URINARY TRACT ULTRASOUND COMPLETE COMPARISON:  None. FINDINGS: Right Kidney: Length: 10.3 cm. Echogenicity and renal cortical thickness are within normal limits. No perinephric fluid or hydronephrosis visualized. There is a cyst arising eccentrically from the lower pole left kidney measuring 4.8 x 4.4 x 3.7 cm. No sonographically demonstrable calculus or ureterectasis. Left Kidney: Length: 11.5 cm. Echogenicity and renal cortical thickness are within normal limits. No perinephric fluid or hydronephrosis visualized. There is a cyst arising from upper to mid left kidney measuring 1.4 x 1.3 x 1.3 cm. No sonographically demonstrable calculus or ureterectasis. Bladder: Appears normal for degree of bladder distention. IMPRESSION: Cyst in each kidney, considerably larger on the right than on the left. Study otherwise unremarkable. Electronically Signed   By: Bretta Bang III M.D.   On: 09-Oct-2017 08:39   Dg Chest Portable 1 View  Result Date: 09/16/2017 CLINICAL DATA:  Tachycardia and chest pain EXAM: PORTABLE CHEST 1 VIEW COMPARISON:  Sep 06, 2013  FINDINGS: There is no edema or consolidation. Heart is upper normal in size with pulmonary vascularity normal. Patient is status post coronary artery bypass grafting. No pneumothorax. No adenopathy. No bone lesions. IMPRESSION: No edema or consolidation.  Heart upper normal in size. Electronically Signed   By: Bretta Bang III M.D.   On: 09/16/2017 17:10    Assessment and Plan:   1. Paroxysmal atrial fibrillation-patient presented in atrial fibrillation with rapid ventricular response.  He was ultimately cardioverted in the emergency room.  Continue amiodarone and transition to oral formulation tomorrow.  Check echocardiogram and TSH. CHADSvasc 5 on 11/20/17.  Continue heparin.  He will need apixaban 5 mg twice daily at discharge. 2. Non-ST elevation myocardial infarction-patient has ruled in.  This may have been related to atrial fibrillation with rapid ventricular response.  However his grafts are 75 years old.  I feel definitive evaluation is warranted.  Plan cardiac catheterization tomorrow.  The risks and benefits including myocardial infarction, CVA, death and worsening renal insufficiency discussed and he agrees to proceed.  We will treat with aspirin, beta-blockade and statin. 3. Ischemic cardiomyopathy-patient has a known ischemic cardiomyopathy.  Will reassess with echocardiogram.  Continue beta-blocker.  Add ARB following cardiac catheterization if blood pressure allows.  Will need reassessment in 3 months and if ejection fraction less than 35% would need to readdress ICD. 4. Chronic stage III kidney disease-gentle hydration prior to cardiac catheterization.  Follow renal function closely after procedure. 5. Tobacco abuse-patient counseled on discontinuing. 6. History of hyperlipidemia-add Crestor 20 mg daily.  I have not added 40 mg given mild elevation in liver functions and addition of amiodarone. 7. Elevated liver functions-we will need follow-up.   For questions or updates, please  contact [facility] Please consult [email or web address] for contact  info under Cardiology/STEMI.   Signed, Olga Millers, MD  09/17/2017 2:14 PM

## 2017-09-17 NOTE — ED Notes (Signed)
Pt bp low 80s/50s. Notified dr.shah. Was given order for 250 bolus of LR. bp still low, dr Sherryll Burger gave order to stop amiodarone.

## 2017-09-17 NOTE — Progress Notes (Addendum)
ANTICOAGULATION CONSULT NOTE  Pharmacy Consult for Heparin Indication: chest pain/ACS  Allergies  Allergen Reactions  . Sulfonamide Derivatives     Patient Measurements: Height:  (188 cm) Weight: 218 lb (98.9 kg) IBW/kg (Calculated) : 82.2 HEPARIN DW (KG): 98.9   Vital Signs: BP: 102/80 (05/27 0745) Pulse Rate: 38 (05/27 0745)  Labs: Recent Labs    09/16/17 1647 09/16/17 2125 09/17/17 0246 09/17/17 0521  HGB 15.9 14.0  --  13.3  HCT 47.5 43.5  --  41.0  PLT 185 150  --  140*  LABPROT 15.2  --   --   --   INR 1.21  --   --   --   HEPARINUNFRC  --   --   --  0.54  CREATININE 1.78* 1.64*  --  1.54*  TROPONINI 0.03* 0.67* 1.30*  --     Estimated Creatinine Clearance: 52.9 mL/min (A) (by C-G formula based on SCr of 1.54 mg/dL (H)).  Assessment: Okay for Protocol, SQ Heparin 5000 units given.  Heparin level at goal.  Elevated troponin.  PLTC 140K,  Goal of Therapy:  Heparin level 0.3-0.7 units/ml Monitor platelets by anticoagulation protocol: Yes   Plan:  Continue heparin infusion at 1300 units/hr Check anti-Xa level daily while on heparin Continue to monitor H&H and platelets  Mady Gemma 09/17/2017,8:02 AM

## 2017-09-17 NOTE — Progress Notes (Signed)
Patient seen in transfer form [facility], already seen earlier prior to transfer by Dr. Gonzella Lex. Patient conversant, in NAD. In sinus. Have discussed case with Cardiology who will evaluate.

## 2017-09-17 NOTE — Progress Notes (Signed)
PROGRESS NOTE                                                                                                                                                                                                             Patient Demographics:    Harold English, is a 77 y.o. male, DOB - Nov 01, 1940, WUJ:[id number]  Admit date - 09/16/2017   Admitting Physician Pratik Hoover Brunette, DO  Outpatient Primary MD for the patient is No primary care provider on file.  LOS - 1  Outpatient Specialists: none  Chief Complaint  Patient presents with  . Tachycardia       Brief Narrative  77 year old male with history of coronary artery disease with MI in 2004, CABG over 10 years back, ischemic cardiomyopathy with EF of 25-30% as per echo in 2011, ongoing tobacco use, hypertension and dyslipidemia presented to the ED by EMS after feeling lightheaded and short of breath with some chest pressure while he was working outside.  EMS found him to be having wide-complex tachyarrhythmia with heart rate >200 for which she was given a dose of amiodarone.  The heart rate improved to 150 bpm a and was found to be in rapid A. fib.  Patient was hemodynamically unstable in the ED and received a synchronized shock of 150 J and then converted to sinus rhythm.  Given his soft blood pressure he was placed on amiodarone drip.  Blood work also showed mild leukocytosis, AKI and mild transaminitis. Cardiology at [facility] was consulted and patient waiting to be transferred there to stepdown unit.   Subjective:   Patient seen and examined this morning.  Denies further chest pain, shortness of breath, lightheadedness or palpitations.   Assessment  & Plan :    Principal Problem:   Ventricular tachyarrhythmia (HCC) Atrial fibrillation with RVR Symptomatic.  Likely contributed by NSTEMI.  Received cardioversion in the ED and placed on amiodarone drip due to soft blood  pressure. Continue aspirin and daily Coreg.  Electrolytes normal. Now on amiodarone drip.  Rate controlled. Repeat 2D echo ordered.   Active Problems: NSTEMI Troponin elevated to 1.17.  Started on heparin drip.  Continue aspirin and Coreg.  Transfer to [facility] for cardiology evaluation.  Acute versus acute on chronic kidney disease stage III Getting gentle hydration.  Avoid nephrotoxins.  Renal ultrasound ordered.  Hold Aldactone.  CAD with history of CABG and ischemic cardiomyopathy Patient clinically hypovolemic.  Getting IV hydration.  Continue aspirin and beta-blocker.  Holding statin due to transaminitis.  Transaminitis Mild.?  Shock liver.  Holding statin.  Monitor closely  Ongoing tobacco use Counseled on cessation.  Nicotine patch.  Hyperglycemia No history of diabetes.  Check A1c.  Monitor on SSI.  GERD Continue PPI          Code Status : Full code  Family Communication  : None at bedside  Disposition Plan  : To [facility] for cardiology evaluation  Barriers For Discharge : Active symptoms  Consults  : Cardiology at [facility]  Procedures  : 2D echo (pending)  DVT Prophylaxis  : IV heparin  Lab Results  Component Value Date   PLT 140 (L) 09/17/2017    Antibiotics  :   Anti-infectives (From admission, onward)   None        Objective:   Vitals:   09/17/17 0915 09/17/17 0930 09/17/17 0945 09/17/17 1000  BP: 110/84 107/83 102/81 103/63  Pulse: 85 66 78 81  Resp: 17 (!) SpO2: 95% 94% 96% 95%  Weight:      Height:        Wt Readings from Last 3 Encounters:  09/16/17 98.9 kg (218 lb)  09/06/13 108.9 kg (240 lb)     Intake/Output Summary (Last 24 hours) at 09/17/2017 1023 Last data filed at 09/17/2017 7829 Gross per 24 hour  Intake 2000 ml  Output 300 ml  Net 1700 ml     Physical Exam  Gen: not in distress fatigued HEENT: no pallor, moist mucosa, supple neck Chest: clear b/l, no added sounds CVS: S1 and S2  irregular, no murmurs rub or gallop GI: soft, NT, ND, BS+ Musculoskeletal: warm, no edema     Data Review:    CBC Recent Labs  Lab 09/16/17 1647 09/16/17 2125 09/17/17 0521  WBC 12.0* 10.6* 10.4  HGB 15.9 14.0 13.3  HCT 47.5 43.5 41.0  PLT 185 150 140*  MCV 93.9 94.2 94.3  MCH 31.4 30.3 30.6  MCHC 33.5 32.2 32.4  RDW 13.9 13.9 14.0  LYMPHSABS 2.7  --   --   MONOABS 0.7  --   --   EOSABS 0.3  --   --   BASOSABS 0.0  --   --     Chemistries  Recent Labs  Lab 09/16/17 1647 09/16/17 2125 09/17/17 0521  NA 137  --  139  K 4.9  --  4.4  CL 104  --  107  CO2 21*  --  25  GLUCOSE 212*  --  96  BUN 23*  --  22*  CREATININE 1.78* 1.64* 1.54*  CALCIUM 9.2  --  8.4*  MG 2.1  --   --   AST 91*  --  49*  ALT 92*  --  70*  ALKPHOS 50  --  45  BILITOT 1.9*  --  1.1   ------------------------------------------------------------------------------------------------------------------ No results for input(s): CHOL, HDL, LDLCALC, TRIG, CHOLHDL, LDLDIRECT in the last 72 hours.  No results found for: HGBA1C ------------------------------------------------------------------------------------------------------------------ No results for input(s): TSH, T4TOTAL, T3FREE, THYROIDAB in the last 72 hours.  Invalid input(s): FREET3 ------------------------------------------------------------------------------------------------------------------ No results for input(s): VITAMINB12, FOLATE, FERRITIN, TIBC, IRON, RETICCTPCT in the last 72 hours.  Coagulation profile Recent Labs  Lab 09/16/17 1647  INR 1.21    No results for input(s): DDIMER in the last 72  hours.  Cardiac Enzymes Recent Labs  Lab 09/16/17 2125 09/17/17 0246 09/17/17 0833  TROPONINI 0.67* 1.30* 1.17*   ------------------------------------------------------------------------------------------------------------------ No results found for: BNP  Inpatient Medications  Scheduled Meds: . aspirin EC  325 mg Oral  QHS  . carvedilol  12.5 mg Oral QHS  . famotidine  20 mg Oral QHS  . insulin aspart  0-9 Units Subcutaneous Q4H  . nicotine  7 mg Transdermal Daily   Continuous Infusions: . amiodarone 30 mg/hr (09/17/17 0757)  . heparin 1,300 Units/hr (09/16/17 2243)  . lactated ringers     PRN Meds:.acetaminophen **OR** acetaminophen, ondansetron **OR** ondansetron (ZOFRAN) IV  Micro Results No results found for this or any previous visit (from the past 240 hour(s)).  Radiology Reports US Renal  Result Date: 09/17/2017 CLINICAL DATA:  Chronic renal disease EXAM: RENAL / URINARY TRACT ULTRASOUND COMPLETE COMPARISON:  None. FINDINGS: Right Kidney: Length: 10.3 cm. Echogenicity and renal cortical thickness are within normal limits. No perinephric fluid or hydronephrosis visualized. There is a cyst arising eccentrically from the lower pole left kidney measuring 4.8 x 4.4 x 3.7 cm. No sonographically demonstrable calculus or ureterectasis. Left Kidney: Length: 11.5 cm. Echogenicity and renal cortical thickness are within normal limits. No perinephric fluid or hydronephrosis visualized. There is a cyst arising from upper to mid left kidney measuring 1.4 x 1.3 x 1.3 cm. No sonographically demonstrable calculus or ureterectasis. Bladder: Appears normal for degree of bladder distention. IMPRESSION: Cyst in each kidney, considerably larger on the right than on the left. Study otherwise unremarkable. Electronically Signed   By: Bretta Bang III M.D.   On: 09/17/2017 08:39   Dg Chest Portable 1 View  Result Date: 09/16/2017 CLINICAL DATA:  Tachycardia and chest pain EXAM: PORTABLE CHEST 1 VIEW COMPARISON:  Sep 06, 2013 FINDINGS: There is no edema or consolidation. Heart is upper normal in size with pulmonary vascularity normal. Patient is status post coronary artery bypass grafting. No pneumothorax. No adenopathy. No bone lesions. IMPRESSION: No edema or consolidation.  Heart upper normal in size. Electronically  Signed   By: Bretta Bang III M.D.   On: 09/16/2017 17:10    Time Spent in minutes  35   Luxe Cuadros M.D on 09/17/2017 at 10:23 AM  Between 7am to 7pm - Pager - [phone]  After 7pm go to [email or web address] - password [facility]  [facility] -  Office  [phone]

## 2017-09-17 NOTE — ED Notes (Signed)
Dr. Gonzella Lex informed that Amiodarone drip was stopped and no new orders were given.  Verbal order given to restart Amiodarone drip at 16.16ml/hr.  Cardiac monitor shows SR with occasional PVC rate 80.  Denies any pain at this time.

## 2017-09-17 NOTE — Progress Notes (Signed)
Admitted from [facility]. Via [facility] awake and alert.

## 2017-09-17 NOTE — ED Notes (Signed)
CRITICAL VALUE ALERT  Critical Value:  Troponin 1.17  Date & Time Notied:  09/17/17 @ 1011  Provider Notified: Dr Gonzella Lex  Orders Received/Actions taken: see new orders.

## 2017-09-17 NOTE — ED Notes (Signed)
CRITICAL VALUE ALERT  Critical Value:  Troponin 1.30  Date & Time Notied:  09/17/2017 0336  Provider Notified: dr.shah  Orders Received/Actions taken: md notified

## 2017-09-17 NOTE — ED Notes (Signed)
Hospitalist paged

## 2017-09-17 NOTE — ED Notes (Signed)
Dr shah at bedside.  

## 2017-09-17 NOTE — ED Notes (Signed)
Dr Gonzella Lex paged.

## 2017-09-18 ENCOUNTER — Inpatient Hospital Stay (HOSPITAL_COMMUNITY)
Admission: EM | Disposition: A | Discharge status: Home Health | Payer: Self-pay | Source: Home / Self Care | Attending: Internal Medicine

## 2017-09-18 ENCOUNTER — Inpatient Hospital Stay (HOSPITAL_COMMUNITY): Payer: Medicare Other

## 2017-09-18 ENCOUNTER — Other Ambulatory Visit (HOSPITAL_COMMUNITY): Payer: Medicare Other

## 2017-09-18 DIAGNOSIS — I361 Nonrheumatic tricuspid (valve) insufficiency: Secondary | ICD-10-CM

## 2017-09-18 DIAGNOSIS — E782 Mixed hyperlipidemia: Secondary | ICD-10-CM

## 2017-09-18 DIAGNOSIS — I472 Ventricular tachycardia: Secondary | ICD-10-CM

## 2017-09-18 DIAGNOSIS — I2510 Atherosclerotic heart disease of native coronary artery without angina pectoris: Secondary | ICD-10-CM

## 2017-09-18 DIAGNOSIS — F17200 Nicotine dependence, unspecified, uncomplicated: Secondary | ICD-10-CM

## 2017-09-18 DIAGNOSIS — I25110 Atherosclerotic heart disease of native coronary artery with unstable angina pectoris: Secondary | ICD-10-CM

## 2017-09-18 DIAGNOSIS — I10 Essential (primary) hypertension: Secondary | ICD-10-CM

## 2017-09-18 HISTORY — PX: CORONARY STENT INTERVENTION: CATH118234

## 2017-09-18 HISTORY — PX: LEFT HEART CATH AND CORS/GRAFTS ANGIOGRAPHY: CATH118250

## 2017-09-18 LAB — BASIC METABOLIC PANEL
ANION GAP: 9 (ref 5–15)
BUN: 15 mg/dL (ref 6–20)
CALCIUM: 8.2 mg/dL — AB (ref 8.9–10.3)
CO2: 22 mmol/L (ref 22–32)
Chloride: 107 mmol/L (ref 101–111)
Creatinine, Ser: 1.51 mg/dL — ABNORMAL HIGH (ref 0.61–1.24)
GFR calc Af Amer: 51 mL/min — ABNORMAL LOW (ref >60)
GFR, EST NON AFRICAN AMERICAN: 44 mL/min — AB (ref >60)
GLUCOSE: 116 mg/dL — AB (ref 65–99)
Potassium: 4.1 mmol/L (ref 3.5–5.1)
SODIUM: 138 mmol/L (ref 135–145)

## 2017-09-18 LAB — CBC
HCT: 41.4 % (ref 39.0–52.0)
Hemoglobin: 13.2 g/dL (ref 13.0–17.0)
MCH: 30.3 pg (ref 26.0–34.0)
MCHC: 31.9 g/dL (ref 30.0–36.0)
MCV: 95 fL (ref 78.0–100.0)
PLATELETS: 124 10*3/uL — AB (ref 150–400)
RBC: 4.36 MIL/uL (ref 4.22–5.81)
RDW: 13.8 % (ref 11.5–15.5)
WBC: 11 10*3/uL — ABNORMAL HIGH (ref 4.0–10.5)

## 2017-09-18 LAB — TSH: TSH: 2.937 u[IU]/mL (ref 0.350–4.500)

## 2017-09-18 LAB — ECHOCARDIOGRAM COMPLETE
HEIGHTINCHES: 74 in
WEIGHTICAEL: 3463.87 [oz_av]

## 2017-09-18 LAB — POCT ACTIVATED CLOTTING TIME: Activated Clotting Time: 257 seconds

## 2017-09-18 LAB — HEPARIN LEVEL (UNFRACTIONATED): HEPARIN UNFRACTIONATED: 0.53 [IU]/mL (ref 0.30–0.70)

## 2017-09-18 SURGERY — LEFT HEART CATH AND CORS/GRAFTS ANGIOGRAPHY
Anesthesia: LOCAL

## 2017-09-18 MED ORDER — LABETALOL HCL 5 MG/ML IV SOLN: 10.0000 mg | INTRAVENOUS | Status: AC | PRN

## 2017-09-18 MED ORDER — SODIUM CHLORIDE 0.9% FLUSH
3.0000 mL | Freq: Two times a day (BID) | INTRAVENOUS | Status: DC
  Administered 2017-09-18: 3 mL via INTRAVENOUS

## 2017-09-18 MED ORDER — VERAPAMIL HCL 2.5 MG/ML IV SOLN
INTRAVENOUS | Status: AC
  Filled 2017-09-18: qty 2

## 2017-09-18 MED ORDER — CLOPIDOGREL BISULFATE 300 MG PO TABS
ORAL_TABLET | ORAL | Status: AC
  Filled 2017-09-18: qty 2

## 2017-09-18 MED ORDER — HEPARIN SODIUM (PORCINE) 1000 UNIT/ML IJ SOLN
INTRAMUSCULAR | Status: AC
  Filled 2017-09-18: qty 1

## 2017-09-18 MED ORDER — MIDAZOLAM HCL 2 MG/2ML IJ SOLN
INTRAMUSCULAR | Status: AC
  Filled 2017-09-18: qty 2

## 2017-09-18 MED ORDER — APIXABAN 5 MG PO TABS
5.0000 mg | ORAL_TABLET | Freq: Two times a day (BID) | ORAL | Status: DC
  Administered 2017-09-18 – 2017-09-19 (×2): 5 mg via ORAL
  Filled 2017-09-18 (×2): qty 1

## 2017-09-18 MED ORDER — HEPARIN (PORCINE) IN NACL 1000-0.9 UT/500ML-% IV SOLN
INTRAVENOUS | Status: AC
  Filled 2017-09-18: qty 500

## 2017-09-18 MED ORDER — HEPARIN SODIUM (PORCINE) 1000 UNIT/ML IJ SOLN
INTRAMUSCULAR | Status: DC | PRN
  Administered 2017-09-18 (×2): 5000 [IU] via INTRAVENOUS
  Administered 2017-09-18: 3000 [IU] via INTRAVENOUS

## 2017-09-18 MED ORDER — PERFLUTREN LIPID MICROSPHERE
1.0000 mL | INTRAVENOUS | Status: AC | PRN
  Administered 2017-09-18: 2 mL via INTRAVENOUS
  Filled 2017-09-18: qty 10

## 2017-09-18 MED ORDER — IOHEXOL 350 MG/ML SOLN
INTRAVENOUS | Status: DC | PRN
  Administered 2017-09-18: 110 mL via INTRA_ARTERIAL

## 2017-09-18 MED ORDER — NITROGLYCERIN 1 MG/10 ML FOR IR/CATH LAB
INTRA_ARTERIAL | Status: AC
  Filled 2017-09-18: qty 10

## 2017-09-18 MED ORDER — LIDOCAINE HCL (PF) 1 % IJ SOLN
INTRAMUSCULAR | Status: AC
  Filled 2017-09-18: qty 30

## 2017-09-18 MED ORDER — FENTANYL CITRATE (PF) 100 MCG/2ML IJ SOLN
INTRAMUSCULAR | Status: DC | PRN
  Administered 2017-09-18: 25 ug via INTRAVENOUS

## 2017-09-18 MED ORDER — SODIUM CHLORIDE 0.9% FLUSH: 3.0000 mL | INTRAVENOUS | Status: DC | PRN

## 2017-09-18 MED ORDER — CLOPIDOGREL BISULFATE 75 MG PO TABS
75.0000 mg | ORAL_TABLET | Freq: Every day | ORAL | Status: DC
  Administered 2017-09-19: 75 mg via ORAL
  Filled 2017-09-18: qty 1

## 2017-09-18 MED ORDER — CLOPIDOGREL BISULFATE 300 MG PO TABS
ORAL_TABLET | ORAL | Status: DC | PRN
  Administered 2017-09-18: 600 mg via ORAL

## 2017-09-18 MED ORDER — FENTANYL CITRATE (PF) 100 MCG/2ML IJ SOLN
INTRAMUSCULAR | Status: AC
Start: 2017-09-18 — End: ?
  Filled 2017-09-18: qty 2

## 2017-09-18 MED ORDER — SODIUM CHLORIDE 0.9 % IV SOLN: 250.0000 mL | INTRAVENOUS | Status: DC | PRN

## 2017-09-18 MED ORDER — HEPARIN (PORCINE) IN NACL 2-0.9 UNITS/ML
INTRAMUSCULAR | Status: DC | PRN
  Administered 2017-09-18 (×3): 500 mL

## 2017-09-18 MED ORDER — MIDAZOLAM HCL 2 MG/2ML IJ SOLN
INTRAMUSCULAR | Status: DC | PRN
  Administered 2017-09-18: 1 mg via INTRAVENOUS

## 2017-09-18 MED ORDER — VERAPAMIL HCL 2.5 MG/ML IV SOLN
INTRAVENOUS | Status: DC | PRN
  Administered 2017-09-18: 15:00:00 via INTRA_ARTERIAL

## 2017-09-18 MED ORDER — SODIUM CHLORIDE 0.9 % IV SOLN: INTRAVENOUS | Status: AC

## 2017-09-18 MED ORDER — PERFLUTREN LIPID MICROSPHERE
INTRAVENOUS | Status: AC
  Administered 2017-09-18: 13:00:00
  Filled 2017-09-18: qty 10

## 2017-09-18 MED ORDER — HEPARIN (PORCINE) IN NACL 1000-0.9 UT/500ML-% IV SOLN
INTRAVENOUS | Status: AC
  Filled 2017-09-18: qty 1000

## 2017-09-18 MED ORDER — HYDRALAZINE HCL 20 MG/ML IJ SOLN: 5.0000 mg | INTRAMUSCULAR | Status: AC | PRN

## 2017-09-18 MED ORDER — LIDOCAINE HCL (PF) 1 % IJ SOLN
INTRAMUSCULAR | Status: DC | PRN
  Administered 2017-09-18: 2 mL

## 2017-09-18 SURGICAL SUPPLY — 19 items
BALLN SAPPHIRE 2.5X10 (BALLOONS) ×2
BALLN SAPPHIRE ~~LOC~~ 3.25X12 (BALLOONS) ×2 IMPLANT
BALLOON SAPPHIRE 2.5X10 (BALLOONS) ×1 IMPLANT
CATH INFINITI 5 FR IM (CATHETERS) ×2 IMPLANT
CATH INFINITI 5FR AL1 (CATHETERS) ×2 IMPLANT
CATH INFINITI 5FR MULTPACK ANG (CATHETERS) ×2 IMPLANT
CATH LAUNCHER 6FR EBU3.5 (CATHETERS) ×2 IMPLANT
DEVICE RAD COMP TR BAND LRG (VASCULAR PRODUCTS) ×2 IMPLANT
GUIDEWIRE INQWIRE 1.5J.035X260 (WIRE) ×1 IMPLANT
INQWIRE 1.5J .035X260CM (WIRE) ×2
KIT ENCORE 26 ADVANTAGE (KITS) ×2 IMPLANT
KIT HEART LEFT (KITS) ×2 IMPLANT
NEEDLE PERC 21GX4CM (NEEDLE) ×2 IMPLANT
PACK CARDIAC CATHETERIZATION (CUSTOM PROCEDURE TRAY) ×2 IMPLANT
SHEATH RAIN RADIAL 21G 6FR (SHEATH) ×2 IMPLANT
STENT RESOLUTE ONYX 3.0X15 (Permanent Stent) ×2 IMPLANT
TRANSDUCER W/STOPCOCK (MISCELLANEOUS) ×2 IMPLANT
TUBING CIL FLEX 10 FLL-RA (TUBING) ×2 IMPLANT
WIRE COUGAR XT STRL 190CM (WIRE) ×2 IMPLANT

## 2017-09-18 NOTE — H&P (View-Only) (Signed)
Progress Note  Patient Name: Harold English Date of Encounter: 09/18/2017  Primary Cardiologist:New patient  Subjective   No chest pain or SOB.  Inpatient Medications    Scheduled Meds: . aspirin  81 mg Oral Pre-Cath  . aspirin EC  325 mg Oral QHS  . carvedilol  12.5 mg Oral QHS  . famotidine  20 mg Oral QHS  . insulin aspart  0-9 Units Subcutaneous Q4H  . nicotine  7 mg Transdermal Daily  . rosuvastatin  20 mg Oral q1800  . sodium chloride flush  3 mL Intravenous Q12H   Continuous Infusions: . sodium chloride    . sodium chloride 1 mL/kg/hr (09/18/17 0740)  . amiodarone 30 mg/hr (09/18/17 0524)  . heparin 1,300 Units/hr (09/17/17 1800)  . lactated ringers 100 mL/hr at 09/17/17 1039   PRN Meds: sodium chloride, acetaminophen **OR** acetaminophen, ondansetron **OR** ondansetron (ZOFRAN) IV, sodium chloride flush   Vital Signs    Vitals:   09/17/17 2300 09/18/17 0015 09/18/17 0400 09/18/17 0700  BP: 92/72  103/75 104/79  Pulse: 79 80 88 84  Resp: (!) Temp: (!) 97.5 F (36.4 C)  98 F (36.7 C) 98.1 F (36.7 C)  TempSrc: Oral  Oral Oral  SpO2: 94% (!) 89% 95% 91%  Weight:   216 lb 7.9 oz (98.2 kg)   Height:        Intake/Output Summary (Last 24 hours) at 09/18/2017 1008 Last data filed at 09/17/2017 2046 Gross per 24 hour  Intake 1277.6 ml  Output 375 ml  Net 902.6 ml   Filed Weights   09/17/17 1200 09/17/17 1939 09/18/17 0400  Weight: 214 lb 8.1 oz (97.3 kg) 214 lb 8.1 oz (97.3 kg) 216 lb 7.9 oz (98.2 kg)    Telemetry    SR - Personally Reviewed  ECG    SSR, RBBB - Personally Reviewed  Physical Exam   GEN: No acute distress.   Neck: No JVD Cardiac: RRR, no murmurs, rubs, or gallops.  Respiratory: Clear to auscultation bilaterally. GI: Soft, nontender, non-distended  MS: No edema; No deformity. Neuro:  Nonfocal  Psych: Normal affect   Labs    Chemistry Recent Labs  Lab 09/16/17 1647 09/16/17 2125 09/17/17 0521  09/18/17 0444  NA 137  --  139 138  K 4.9  --  4.4 4.1  CL 104  --  107 107  CO2 21*  --  25 22  GLUCOSE 212*  --  96 116*  BUN 23*  --  22* 15  CREATININE 1.78* 1.64* 1.54* 1.51*  CALCIUM 9.2  --  8.4* 8.2*  PROT 6.8  --  5.6*  --   ALBUMIN 3.8  --  3.2*  --   AST 91*  --  49*  --   ALT 92*  --  70*  --   ALKPHOS 50  --  45  --   BILITOT 1.9*  --  1.1  --   GFRNONAA 36* 40* 43* 44*  GFRAA 42* 46* 50* 51*  ANIONGAP 12  --  7 9     Hematology Recent Labs  Lab 09/16/17 2125 09/17/17 0521 09/18/17 0444  WBC 10.6* 10.4 11.0*  RBC 4.62 4.35 4.36  HGB 14.0 13.3 13.2  HCT 43.5 41.0 41.4  MCV 94.2 94.3 95.0  MCH 30.3 30.6 30.3  MCHC 32.2 32.4 31.9  RDW 13.9 14.0 13.8  PLT 150 140* 124*    Cardiac Enzymes Recent Labs  Lab 09/16/17 1647 09/16/17 2125 09/17/17 0246 09/17/17 0833  TROPONINI 0.03* 0.67* 1.30* 1.17*    Recent Labs  Lab 09/16/17 1644  TROPIPOC 0.03     BNPNo results for input(s): BNP, PROBNP in the last 168 hours.   DDimer No results for input(s): DDIMER in the last 168 hours.   Radiology    US Renal  Result Date: 09/17/2017 CLINICAL DATA:  Chronic renal disease EXAM: RENAL / URINARY TRACT ULTRASOUND COMPLETE COMPARISON:  None. FINDINGS: Right Kidney: Length: 10.3 cm. Echogenicity and renal cortical thickness are within normal limits. No perinephric fluid or hydronephrosis visualized. There is a cyst arising eccentrically from the lower pole left kidney measuring 4.8 x 4.4 x 3.7 cm. No sonographically demonstrable calculus or ureterectasis. Left Kidney: Length: 11.5 cm. Echogenicity and renal cortical thickness are within normal limits. No perinephric fluid or hydronephrosis visualized. There is a cyst arising from upper to mid left kidney measuring 1.4 x 1.3 x 1.3 cm. No sonographically demonstrable calculus or ureterectasis. Bladder: Appears normal for degree of bladder distention. IMPRESSION: Cyst in each kidney, considerably larger on the right than  on the left. Study otherwise unremarkable. Electronically Signed   By: Bretta Bang III M.D.   On: 09/17/2017 08:39   Dg Chest Portable 1 View  Result Date: 09/16/2017 CLINICAL DATA:  Tachycardia and chest pain EXAM: PORTABLE CHEST 1 VIEW COMPARISON:  Sep 06, 2013 FINDINGS: There is no edema or consolidation. Heart is upper normal in size with pulmonary vascularity normal. Patient is status post coronary artery bypass grafting. No pneumothorax. No adenopathy. No bone lesions. IMPRESSION: No edema or consolidation.  Heart upper normal in size. Electronically Signed   By: Bretta Bang III M.D.   On: 09/16/2017 17:10    Cardiac Studies   Echo is pending  Patient Profile     77 y.o. male   Assessment & Plan    1. Paroxysmal atrial fibrillation-patient presented in atrial fibrillation with rapid ventricular response.  He was ultimately cardioverted in the emergency room.  Continue amiodarone and transition to oral formulation tomorrow.  Check echocardiogram and TSH. CHADSvasc 5 on 11/20/17.  Continue heparin.  He will need apixaban 5 mg twice daily at discharge. 2. Non-ST elevation myocardial infarction-patient has ruled in.  This may have been related to atrial fibrillation with rapid ventricular response.  However his grafts are 75 years old.  I feel definitive evaluation is warranted.  Plan cardiac catheterization - possibly today, the patient only willing to have it done by Dr Excell Seltzer, Dr Katrinka Blazing or wants to be moved to [facility].  The risks and benefits including myocardial infarction, CVA, death and worsening renal insufficiency discussed and he agrees to proceed.  We will treat with aspirin, beta-blockade and statin. 3. Ischemic cardiomyopathy-patient has a known ischemic cardiomyopathy.  Will reassess with echocardiogram, pending.  Continue beta-blocker.  Add ARB following cardiac catheterization if blood pressure allows.  Will need reassessment in 3 months and if ejection fraction less  than 35% would need to readdress ICD. 4. Chronic stage III kidney disease-gentle hydration prior to cardiac catheterization.  Follow renal function closely after procedure. 5. Tobacco abuse-patient counseled on discontinuing. 6. History of hyperlipidemia-add Crestor 20 mg daily.  I have not added 40 mg given mild elevation in liver functions and addition of amiodarone. 7. Elevated liver functions-improving.  For questions or updates, please contact [facility] Please consult [email or web address] for contact info under Cardiology/STEMI.     Signed, Tobias Alexander, MD  09/18/2017, 10:08 AM

## 2017-09-18 NOTE — Progress Notes (Signed)
  Echocardiogram 2D Echocardiogram has been performed.  Harold English 09/18/2017, 1:05 PM

## 2017-09-18 NOTE — Progress Notes (Signed)
PROGRESS NOTE    SHANNEN VERNON  ZOX:[id number] DOB: 1940/05/14 DOA: 09/16/2017 PCP: No primary care provider on file.    Brief Narrative:  77 year old male with history of coronary artery disease with MI in 2004, CABG over 10 years back, ischemic cardiomyopathy with EF of 25-30% as per echo in 2011, ongoing tobacco use, hypertension and dyslipidemia presented to the ED by EMS after feeling lightheaded and short of breath with some chest pressure while he was working outside.  EMS found him to be having wide-complex tachyarrhythmia with heart rate >200 for which she was given a dose of amiodarone.  The heart rate improved to 150 bpm a and was found to be in rapid A. fib.  Patient was hemodynamically unstable in the ED and received a synchronized shock of 150 J and then converted to sinus rhythm.  Given his soft blood pressure he was placed on amiodarone drip.  Blood work also showed mild leukocytosis, AKI and mild transaminitis. Cardiology at [facility] was consulted and patient waiting to be transferred there to stepdown unit.  Assessment & Plan:   Principal Problem:   Ventricular tachyarrhythmia (HCC) Active Problems:   Mixed hyperlipidemia   TOBACCO ABUSE   Essential hypertension, benign   CORONARY ATHEROSCLEROSIS NATIVE CORONARY ARTERY   Chest pain  Ventricular tachyarrhythmia (HCC) Atrial fibrillation with RVR Symptomatic.  Patient cardioverted in the ED and placed on amiodarone drip due to soft blood pressure. Continue aspirin and daily Coreg. Cardiology following. Plan to transition to PO amio tomorrow Will need eliquis  bid at time of discharge  Active Problems: NSTEMI Troponin elevated to 1.17.  Started on heparin drip.  Continue aspirin and Coreg. Patient transferred to [facility] from [facility] for heart cath Pt now s/p cath as of 5/28  Acute versus acute on chronic kidney disease stage III Getting gentle hydration.  Avoid nephrotoxins. Held Aldactone. Repeat bmet in AM  CAD  with history of CABG and ischemic cardiomyopathy Patient clinically hypovolemic at initial presentation.  Getting IV hydration.  Continue aspirin and beta-blocker.  Holding statin due to concerns of transaminitis.  Transaminitis Mild.?  Shock liver.  Holding statin. Repeat LFT in AM  Ongoing tobacco use Counseled on cessation.  Nicotine patch.  Hyperglycemia No history of diabetes.  Check A1c.  Monitor on SSI.  GERD Continue PPI as tolerated  DVT prophylaxis: heparin gtt Code Status: Full Family Communication: Pt in room, family not at bedside Disposition Plan: Uncertain at this time  Consultants:   Cardiology  Procedures:     Antimicrobials: Anti-infectives (From admission, onward)   None       Subjective: Eager to go home  Objective: Vitals:   09/18/17 0400 09/18/17 0700 09/18/17 1140 09/18/17 1455  BP: 103/75 104/79 104/81   Pulse: 88 84 89   Resp: Temp: 98 F (36.7 C) 98.1 F (36.7 C) 98.6 F (37 C)   TempSrc: Oral Oral Oral   SpO2: 95% 91% 92% 95%  Weight: 98.2 kg (216 lb 7.9 oz)     Height:        Intake/Output Summary (Last 24 hours) at 09/18/2017 1559 Last data filed at 09/18/2017 1349 Gross per 24 hour  Intake 758.8 ml  Output 1075 ml  Net -316.2 ml   Filed Weights   09/17/17 1200 09/17/17 1939 09/18/17 0400  Weight: 97.3 kg (214 lb 8.1 oz) 97.3 kg (214 lb 8.1 oz) 98.2 kg (216 lb 7.9 oz)    Examination: General  exam: Conversant, in no acute distress Respiratory system: normal chest rise, clear, no audible wheezing Cardiovascular system: regular rhythm, s1-s2 Gastrointestinal system: Nondistended, nontender, pos BS Central nervous system: No seizures, no tremors Extremities: No cyanosis, no joint deformities Skin: No rashes, no pallor Psychiatry: Affect normal // no auditory hallucinations   Data Reviewed: I have personally reviewed following labs and imaging studies  CBC: Recent Labs  Lab 09/16/17 1647  09/16/17 2125 09/17/17 0521 09/18/17 0444  WBC 12.0* 10.6* 10.4 11.0*  NEUTROABS 8.3*  --   --   --   HGB 15.9 14.0 13.3 13.2  HCT 47.5 43.5 41.0 41.4  MCV 93.9 94.2 94.3 95.0  PLT 185 150 140* 124*   Basic Metabolic Panel: Recent Labs  Lab 09/16/17 1647 09/16/17 2125 09/17/17 0521 09/18/17 0444  NA 137  --  139 138  K 4.9  --  4.4 4.1  CL 104  --  107 107  CO2 21*  --  25 22  GLUCOSE 212*  --  96 116*  BUN 23*  --  22* 15  CREATININE 1.78* 1.64* 1.54* 1.51*  CALCIUM 9.2  --  8.4* 8.2*  MG 2.1  --   --   --   PHOS 4.4  --   --   --    GFR: Estimated Creatinine Clearance: 49.9 mL/min (A) (by C-G formula based on SCr of 1.51 mg/dL (H)). Liver Function Tests: Recent Labs  Lab 09/16/17 1647 09/17/17 0521  AST 91* 49*  ALT 92* 70*  ALKPHOS 50 45  BILITOT 1.9* 1.1  PROT 6.8 5.6*  ALBUMIN 3.8 3.2*   No results for input(s): LIPASE, AMYLASE in the last 168 hours. No results for input(s): AMMONIA in the last 168 hours. Coagulation Profile: Recent Labs  Lab 09/16/17 1647  INR 1.21   Cardiac Enzymes: Recent Labs  Lab 09/16/17 1647 09/16/17 2125 09/17/17 0246 09/17/17 0833  TROPONINI 0.03* 0.67* 1.30* 1.17*   BNP (last 3 results) No results for input(s): PROBNP in the last 8760 hours. HbA1C: Recent Labs    09/16/17 2125  HGBA1C 5.5   CBG: Recent Labs  Lab 09/17/17 0526 09/17/17 0748 09/17/17 1250 09/17/17 1559 09/17/17 2044  GLUCAP 97 89 102* 120* 125*   Lipid Profile: No results for input(s): CHOL, HDL, LDLCALC, TRIG, CHOLHDL, LDLDIRECT in the last 72 hours. Thyroid Function Tests: Recent Labs    09/18/17 0444  TSH 2.937   Anemia Panel: No results for input(s): VITAMINB12, FOLATE, FERRITIN, TIBC, IRON, RETICCTPCT in the last 72 hours. Sepsis Labs: No results for input(s): PROCALCITON, LATICACIDVEN in the last 168 hours.  Recent Results (from the past 240 hour(s))  MRSA PCR Screening     Status: None   Collection Time: 09/17/17 11:48 AM   Result Value Ref Range Status   MRSA by PCR NEGATIVE NEGATIVE Final    Comment:        The GeneXpert MRSA Assay (FDA approved for NASAL specimens only), is one component of a comprehensive MRSA colonization surveillance program. It is not intended to diagnose MRSA infection nor to guide or monitor treatment for MRSA infections. Performed at [facility], [address] [place], [place] [address]      Radiology Studies: US Renal  Result Date: 09/17/2017 CLINICAL DATA:  Chronic renal disease EXAM: RENAL / URINARY TRACT ULTRASOUND COMPLETE COMPARISON:  None. FINDINGS: Right Kidney: Length: 10.3 cm. Echogenicity and renal cortical thickness are within normal limits. No perinephric fluid or hydronephrosis visualized.  There is a cyst arising eccentrically from the lower pole left kidney measuring 4.8 x 4.4 x 3.7 cm. No sonographically demonstrable calculus or ureterectasis. Left Kidney: Length: 11.5 cm. Echogenicity and renal cortical thickness are within normal limits. No perinephric fluid or hydronephrosis visualized. There is a cyst arising from upper to mid left kidney measuring 1.4 x 1.3 x 1.3 cm. No sonographically demonstrable calculus or ureterectasis. Bladder: Appears normal for degree of bladder distention. IMPRESSION: Cyst in each kidney, considerably larger on the right than on the left. Study otherwise unremarkable. Electronically Signed   By: Bretta Bang III M.D.   On: 09/17/2017 08:39   Dg Chest Portable 1 View  Result Date: 09/16/2017 CLINICAL DATA:  Tachycardia and chest pain EXAM: PORTABLE CHEST 1 VIEW COMPARISON:  Sep 06, 2013 FINDINGS: There is no edema or consolidation. Heart is upper normal in size with pulmonary vascularity normal. Patient is status post coronary artery bypass grafting. No pneumothorax. No adenopathy. No bone lesions. IMPRESSION: No edema or consolidation.  Heart upper normal in size. Electronically Signed   By: Bretta Bang III M.D.   On:  09/16/2017 17:10    Scheduled Meds: . [MAR Hold] aspirin EC  325 mg Oral QHS  . [MAR Hold] carvedilol  12.5 mg Oral QHS  . [MAR Hold] famotidine  20 mg Oral QHS  . [MAR Hold] insulin aspart  0-9 Units Subcutaneous Q4H  . [MAR Hold] nicotine  7 mg Transdermal Daily  . [MAR Hold] rosuvastatin  20 mg Oral q1800  . sodium chloride flush  3 mL Intravenous Q12H   Continuous Infusions: . sodium chloride    . sodium chloride 1 mL/kg/hr (09/18/17 0740)  . amiodarone 30 mg/hr (09/18/17 0524)  . heparin 1,300 Units/hr (09/17/17 1800)  . heparin    . lactated ringers 100 mL/hr at 09/17/17 1039     LOS: 2 days   Rickey Barbara, MD [facility] Pager [phone]  If 7PM-7AM, please contact night-coverage [email or web address] Password [facility] 09/18/2017, 3:59 PM

## 2017-09-18 NOTE — Progress Notes (Signed)
Pt refused morning labs as well as CBG monitoring all night. Pt states he is not diabetic, so he does not need his glucose checked every four hours. Pt also states he is not willing to go to the cath lab in the morning if "Dr. Katrinka Blazing" will not be performing the procedure. Pt is prepped and has been NPO for in case he changes his mind.

## 2017-09-18 NOTE — Interval H&P Note (Signed)
Cath Lab Visit (complete for each Cath Lab visit)  Clinical Evaluation Leading to the Procedure:   ACS: Yes.    Non-ACS:    Anginal Classification: CCS IV  Anti-ischemic medical therapy: Minimal Therapy (1 class of medications)  Non-Invasive Test Results: No non-invasive testing performed  Prior CABG: Previous CABG      History and Physical Interval Note:  09/18/2017 2:49 PM  Harold English  has presented today for surgery, with the diagnosis of NSTEMI  The various methods of treatment have been discussed with the patient and family. After consideration of risks, benefits and other options for treatment, the patient has consented to  Procedure(s): LEFT HEART CATH AND CORS/GRAFTS ANGIOGRAPHY (N/A) as a surgical intervention .  The patient's history has been reviewed, patient examined, no change in status, stable for surgery.  I have reviewed the patient's chart and labs.  Questions were answered to the patient's satisfaction.     Tonny Bollman

## 2017-09-18 NOTE — Progress Notes (Signed)
Entered pts room to find him on the phone telling someone to come pick him and and "carry him to [place]" -Pt upset this morning, stating "its not the people, its the system, and I'm getting the hell out of here" when asked why pt felt that way he stated that he has requested a certain doctor (Dr Katrinka Blazing) to be his cardiologist and if he couldn't have him he was leaving to go to [place].   Pt went on to say that he had a sick wife at home and he was the only hope she had, and he felt that he wasn't going to get the care he needed here, so that he could get back to her. He states that he has heard Dr. Katrinka Blazing is "the best that there is" and he didn't want any less than the best working on him. All VSS stable at this time.

## 2017-09-18 NOTE — Progress Notes (Signed)
Patient alert and verbally responsive denies pain or SOB at this time.  Patient refusing CBG monitoring states "I'm not diabetic and don't need a finger stick".  Patient is cooperative and appropriate with staff.

## 2017-09-18 NOTE — Progress Notes (Signed)
ANTICOAGULATION CONSULT NOTE  Pharmacy Consult for Heparin Indication: chest pain/ACS  Allergies  Allergen Reactions  . Sulfonamide Derivatives     Patient Measurements: Height:  (188 cm) Weight: 216 lb 7.9 oz (98.2 kg) IBW/kg (Calculated) : 82.2 HEPARIN DW (KG): 97.3   Vital Signs: Temp: 98.1 F (36.7 C) (05/28 0700) Temp Source: Oral (05/28 0700) BP: 104/79 (05/28 0700) Pulse Rate: 84 (05/28 0700)  Labs: Recent Labs    09/16/17 1647 09/16/17 2125 09/17/17 0246 09/17/17 0521 09/17/17 0833 09/18/17 0444  HGB 15.9 14.0  --  13.3  --  13.2  HCT 47.5 43.5  --  41.0  --  41.4  PLT 185 150  --  140*  --  124*  LABPROT 15.2  --   --   --   --   --   INR 1.21  --   --   --   --   --   HEPARINUNFRC  --   --   --  0.54  --  0.53  CREATININE 1.78* 1.64*  --  1.54*  --  1.51*  TROPONINI 0.03* 0.67* 1.30*  --  1.17*  --     Estimated Creatinine Clearance: 49.9 mL/min (A) (by C-G formula based on SCr of 1.51 mg/dL (H)).  Assessment: 77 year old male on heparin for r/o acs. Heparin level continues to be at goal this morning on 1300 units/hr. No bleeding issues noted overnight, cbc remains stable.   Goal of Therapy:  Heparin level 0.3-0.7 units/ml Monitor platelets by anticoagulation protocol: Yes  Plan:  Continue heparin infusion at 1300 units/hr Check anti-Xa level daily while on heparin Continue to monitor H&H and platelets  Sheppard Coil PharmD., BCPS Clinical Pharmacist 09/18/2017 9:55 AM

## 2017-09-18 NOTE — Progress Notes (Signed)
 Progress Note  Patient Name: Harold English Date of Encounter: 09/18/2017  Primary Cardiologist:New patient  Subjective   No chest pain or SOB.  Inpatient Medications    Scheduled Meds: . aspirin  81 mg Oral Pre-Cath  . aspirin EC  325 mg Oral QHS  . carvedilol  12.5 mg Oral QHS  . famotidine  20 mg Oral QHS  . insulin aspart  0-9 Units Subcutaneous Q4H  . nicotine  7 mg Transdermal Daily  . rosuvastatin  20 mg Oral q1800  . sodium chloride flush  3 mL Intravenous Q12H   Continuous Infusions: . sodium chloride    . sodium chloride 1 mL/kg/hr (09/18/17 0740)  . amiodarone 30 mg/hr (09/18/17 0524)  . heparin 1,300 Units/hr (09/17/17 1800)  . lactated ringers 100 mL/hr at 09/17/17 1039   PRN Meds: sodium chloride, acetaminophen **OR** acetaminophen, ondansetron **OR** ondansetron (ZOFRAN) IV, sodium chloride flush   Vital Signs    Vitals:   09/17/17 2300 09/18/17 0015 09/18/17 0400 09/18/17 0700  BP: 92/72  103/75 104/79  Pulse: 79 80 88 84  Resp: (!) 21 19 18 20  Temp: (!) 97.5 F (36.4 C)  98 F (36.7 C) 98.1 F (36.7 C)  TempSrc: Oral  Oral Oral  SpO2: 94% (!) 89% 95% 91%  Weight:   216 lb 7.9 oz (98.2 kg)   Height:        Intake/Output Summary (Last 24 hours) at 09/18/2017 1008 Last data filed at 09/17/2017 2046 Gross per 24 hour  Intake 1277.6 ml  Output 375 ml  Net 902.6 ml   Filed Weights   09/17/17 1200 09/17/17 1939 09/18/17 0400  Weight: 214 lb 8.1 oz (97.3 kg) 214 lb 8.1 oz (97.3 kg) 216 lb 7.9 oz (98.2 kg)    Telemetry    SR - Personally Reviewed  ECG    SSR, RBBB - Personally Reviewed  Physical Exam   GEN: No acute distress.   Neck: No JVD Cardiac: RRR, no murmurs, rubs, or gallops.  Respiratory: Clear to auscultation bilaterally. GI: Soft, nontender, non-distended  MS: No edema; No deformity. Neuro:  Nonfocal  Psych: Normal affect   Labs    Chemistry Recent Labs  Lab 09/16/17 1647 09/16/17 2125 09/17/17 0521  09/18/17 0444  NA 137  --  139 138  K 4.9  --  4.4 4.1  CL 104  --  107 107  CO2 21*  --  25 22  GLUCOSE 212*  --  96 116*  BUN 23*  --  22* 15  CREATININE 1.78* 1.64* 1.54* 1.51*  CALCIUM 9.2  --  8.4* 8.2*  PROT 6.8  --  5.6*  --   ALBUMIN 3.8  --  3.2*  --   AST 91*  --  49*  --   ALT 92*  --  70*  --   ALKPHOS 50  --  45  --   BILITOT 1.9*  --  1.1  --   GFRNONAA 36* 40* 43* 44*  GFRAA 42* 46* 50* 51*  ANIONGAP 12  --  7 9     Hematology Recent Labs  Lab 09/16/17 2125 09/17/17 0521 09/18/17 0444  WBC 10.6* 10.4 11.0*  RBC 4.62 4.35 4.36  HGB 14.0 13.3 13.2  HCT 43.5 41.0 41.4  MCV 94.2 94.3 95.0  MCH 30.3 30.6 30.3  MCHC 32.2 32.4 31.9  RDW 13.9 14.0 13.8  PLT 150 140* 124*    Cardiac Enzymes Recent Labs    Lab 09/16/17 1647 09/16/17 2125 09/17/17 0246 09/17/17 0833  TROPONINI 0.03* 0.67* 1.30* 1.17*    Recent Labs  Lab 09/16/17 1644  TROPIPOC 0.03     BNPNo results for input(s): BNP, PROBNP in the last 168 hours.   DDimer No results for input(s): DDIMER in the last 168 hours.   Radiology    Us Renal  Result Date: 09/17/2017 CLINICAL DATA:  Chronic renal disease EXAM: RENAL / URINARY TRACT ULTRASOUND COMPLETE COMPARISON:  None. FINDINGS: Right Kidney: Length: 10.3 cm. Echogenicity and renal cortical thickness are within normal limits. No perinephric fluid or hydronephrosis visualized. There is a cyst arising eccentrically from the lower pole left kidney measuring 4.8 x 4.4 x 3.7 cm. No sonographically demonstrable calculus or ureterectasis. Left Kidney: Length: 11.5 cm. Echogenicity and renal cortical thickness are within normal limits. No perinephric fluid or hydronephrosis visualized. There is a cyst arising from upper to mid left kidney measuring 1.4 x 1.3 x 1.3 cm. No sonographically demonstrable calculus or ureterectasis. Bladder: Appears normal for degree of bladder distention. IMPRESSION: Cyst in each kidney, considerably larger on the right than  on the left. Study otherwise unremarkable. Electronically Signed   By: William  Woodruff III M.D.   On: 09/17/2017 08:39   Dg Chest Portable 1 View  Result Date: 09/16/2017 CLINICAL DATA:  Tachycardia and chest pain EXAM: PORTABLE CHEST 1 VIEW COMPARISON:  Sep 06, 2013 FINDINGS: There is no edema or consolidation. Heart is upper normal in size with pulmonary vascularity normal. Patient is status post coronary artery bypass grafting. No pneumothorax. No adenopathy. No bone lesions. IMPRESSION: No edema or consolidation.  Heart upper normal in size. Electronically Signed   By: William  Woodruff III M.D.   On: 09/16/2017 17:10    Cardiac Studies   Echo is pending  Patient Profile     76 y.o. male   Assessment & Plan    1. Paroxysmal atrial fibrillation-patient presented in atrial fibrillation with rapid ventricular response.  He was ultimately cardioverted in the emergency room.  Continue amiodarone and transition to oral formulation tomorrow.  Check echocardiogram and TSH. CHADSvasc 5 on 11/20/17.  Continue heparin.  He will need apixaban 5 mg twice daily at discharge. 2. Non-ST elevation myocardial infarction-patient has ruled in.  This may have been related to atrial fibrillation with rapid ventricular response.  However his grafts are 75 years old.  I feel definitive evaluation is warranted.  Plan cardiac catheterization - possibly today, the patient only willing to have it done by Dr Cooper, Dr Smith or wants to be moved to [place].  The risks and benefits including myocardial infarction, CVA, death and worsening renal insufficiency discussed and he agrees to proceed.  We will treat with aspirin, beta-blockade and statin. 3. Ischemic cardiomyopathy-patient has a known ischemic cardiomyopathy.  Will reassess with echocardiogram, pending.  Continue beta-blocker.  Add ARB following cardiac catheterization if blood pressure allows.  Will need reassessment in 3 months and if ejection fraction less  than 35% would need to readdress ICD. 4. Chronic stage III kidney disease-gentle hydration prior to cardiac catheterization.  Follow renal function closely after procedure. 5. Tobacco abuse-patient counseled on discontinuing. 6. History of hyperlipidemia-add Crestor 20 mg daily.  I have not added 40 mg given mild elevation in liver functions and addition of amiodarone. 7. Elevated liver functions-improving.  For questions or updates, please contact [facility] Please consult [email or web address] for contact info under Cardiology/STEMI.     Signed, Sabas Frett, MD    09/18/2017, 10:08 AM    

## 2017-09-19 ENCOUNTER — Encounter (HOSPITAL_COMMUNITY): Payer: Self-pay | Admitting: Cardiovascular Disease

## 2017-09-19 DIAGNOSIS — I4891 Unspecified atrial fibrillation: Secondary | ICD-10-CM

## 2017-09-19 DIAGNOSIS — Z95820 Peripheral vascular angioplasty status with implants and grafts: Secondary | ICD-10-CM

## 2017-09-19 MED ORDER — SODIUM CHLORIDE 0.9 % IV BOLUS
250.0000 mL | Freq: Once | INTRAVENOUS | Status: AC
Start: 2017-09-19 — End: 2017-09-19
  Administered 2017-09-19: 250 mL via INTRAVENOUS

## 2017-09-19 MED ORDER — APIXABAN 5 MG PO TABS: 5.0000 mg | ORAL_TABLET | Freq: Two times a day (BID) | ORAL | 0 refills | Status: DC

## 2017-09-19 MED ORDER — AMIODARONE HCL 200 MG PO TABS: 200.0000 mg | ORAL_TABLET | Freq: Every day | ORAL | Status: DC

## 2017-09-19 MED ORDER — AMIODARONE HCL 200 MG PO TABS: 200.0000 mg | ORAL_TABLET | Freq: Every day | ORAL | 0 refills | Status: DC

## 2017-09-19 MED ORDER — CLOPIDOGREL BISULFATE 75 MG PO TABS: 75.0000 mg | ORAL_TABLET | Freq: Every day | ORAL | 0 refills | Status: DC

## 2017-09-19 MED ORDER — AMIODARONE HCL 200 MG PO TABS
200.0000 mg | ORAL_TABLET | Freq: Two times a day (BID) | ORAL | Status: DC
  Administered 2017-09-19: 200 mg via ORAL
  Filled 2017-09-19: qty 1

## 2017-09-19 MED ORDER — AMIODARONE HCL 200 MG PO TABS: 200.0000 mg | ORAL_TABLET | Freq: Two times a day (BID) | ORAL | 0 refills | Status: DC

## 2017-09-19 MED FILL — Nitroglycerin IV Soln 100 MCG/ML in D5W: INTRA_ARTERIAL | Qty: 10 | Status: AC

## 2017-09-19 NOTE — Progress Notes (Signed)
No new orders at this time will continue to monitor B/P patient is stable.  Patient TR band removed from left wrist and gauze pad with transparent dressing applied in it's place.  No active bleeding from site. Patient voiced no concerns at this time.  Will monitor.

## 2017-09-19 NOTE — Progress Notes (Signed)
Pt can get out of the bed easily, most of time steady but recommended pt to use RW at home until get more strength back. Pt understood it. His HR went up to 130's while ambulation, then went back down to 100's for few minutes when he was resting in the bed. HS [employer]

## 2017-09-19 NOTE — Progress Notes (Signed)
New order for another bolus NS .  Patient again refused lab draw for the second time.

## 2017-09-19 NOTE — Progress Notes (Signed)
CARDIAC REHAB PHASE I   PRE:  Rate/Rhythm: 97 afib    BP: sitting 100/77    SaO2: 94 RA  MODE:  Ambulation: 320 ft   POST:  Rate/Rhythm: 133 afib    BP: sitting 111/70     SaO2: 97 RA  Pt slightly unsteady. Sts he has a cane at home and I encouraged him to use it or RW. HR up to 130s afib, asx. Anxious to go home as his wife is suffering from cancer. Ed completed including HF, stent, afib, ex, NTG, low sodium, and CRPII. Will refer to [facility] CRPII.  [id number]   Harriet Masson CES, ACSM 09/19/2017 12:27 PM

## 2017-09-19 NOTE — Discharge Summary (Signed)
Discharge Summary  Harold English ZOX:[id number] DOB: 24-Dec-1940  PCP: Estanislado Pandy, MD  Admit date: 09/16/2017 Discharge date: 09/19/2017  Time spent: , more than 50% time spent on coordination of care  Recommendations for Outpatient Follow-up:  1. F/u with PMD within a week  for hospital discharge follow up, repeat cbc/bmp at follow up 2. F/u with cardiology 3. Home health arranged  Discharge Diagnoses:  Active Hospital Problems   Diagnosis Date Noted  . Ventricular tachyarrhythmia (HCC) 09/16/2017  . Chest pain 09/16/2017  . Essential hypertension, benign 11/17/2009  . CORONARY ATHEROSCLEROSIS NATIVE CORONARY ARTERY 11/17/2009  . Mixed hyperlipidemia 11/17/2009  . TOBACCO ABUSE 11/17/2009    Resolved Hospital Problems  No resolved problems to display.    Discharge Condition: stable  Diet recommendation: heart healthy  Filed Weights   09/17/17 1939 09/18/17 0400 09/19/17 0545  Weight: 97.3 kg (214 lb 8.1 oz) 98.2 kg (216 lb 7.9 oz) 99.7 kg (219 lb 12.8 oz)    History of present illness: (per admitting MD Dr Sherryll Burger at [facility]) Patient coming from: Home  Chief Complaint: Chest pressure/SOB  HPI: Harold English is a 77 y.o. male with medical history significant for hypertension, dyslipidemia, CAD with prior MI in 2004 and prior CABG over 10 years ago, ischemic cardiomyopathy with last LVEF 25 to 30% noted on 10/2009, and ongoing tobacco abuse who presented to the emergency department via EMS after he began feeling lightheaded and short of breath with some chest pressure as he was working outside today.  Upon EMS arrival he was noted to have a wide-complex tachyarrhythmia with a rate over 200 for which he was given amiodarone.  His rate decreased to 150 bpm and was noted to be irregular with an appearance of atrial fibrillation with RVR.  He was somewhat unstable in the emergency department and received a synchronized shock of 150 J and has now converted  to sinus rhythm.  He currently denies any further symptomatology and remains in sinus rhythm with heart rate in the 80 to 90 bpm range.  Patient has not seen a cardiologist in the last 5 years.   ED Course: Vital signs are currently stable with sinus rhythm and 80 to 90 bpm range with some soft blood pressure readings.  He is otherwise asymptomatic.  Laboratory data with some mild liver enzyme elevations with AST 91 and ALT 92.  Troponin of 0.03, mild leukocytosis of 12,000, and a creatinine of 1.78 with no prior comparison.    Hospital Course:  Principal Problem:   Ventricular tachyarrhythmia (HCC) Active Problems:   Mixed hyperlipidemia   TOBACCO ABUSE   Essential hypertension, benign   CORONARY ATHEROSCLEROSIS NATIVE CORONARY ARTERY   Chest pain   PAF , Paroxysmal atrial fibrillation He presented to [place] with afib/rvr, s/p cardioversion at [facility] ED, he is [facility], he was started on amiodarone drip, heart rate better controlled but remain in afib, he is transitioned to oral amiodaone, continued on coreg, he is started on eliquis,  He is cleared to d/c home by cardiology, patient is aware of cardiology follow up.   NSTEMI H/o CABG S/p cath on 5/28, treated successfully with DES implantation (3.0x15 mm Resolute Onyx).  plavix for 6months, continue statin Close follow up with cardiology  Systolic chf: Ischemic cardiomyopathy-patient has a known ischemic cardiomyopathy. LVEF 20-25%. continue beta-blocker. Unable to add ARBgiven hypotension. Will need reassessment in 3 months and if ejection fraction less than 35% would  need to readdress ICD Currently does not appear volume overloaded, he is continue on home meds coreg/spironolacton. Close follow up with cardiology  CKDIII Cr appear stable around 1.5 Renal dosing meds   FTT: deconditioning, arrange home health  Code Status: Full Family Communication: Pt and family at bedside Disposition Plan:home  with home health   Procedures:  Cardioversion  Cardiac cath   Consultations:  cardiology  Discharge Exam: BP 107/82 (BP Location: Right Arm)   Pulse 75   Temp 98 F (36.7 C) (Oral)   Resp (!) 21   Ht  (1.88 m)   Wt 99.7 kg (219 lb 12.8 oz)   SpO2 93%   BMI 28.22 kg/m   General: NAD Cardiovascular: IRRR Respiratory: diminished, no wheezing, no rales, no rhonchi Abdomen: soft, NT/ND, + bs Extremity: no edema   Discharge Instructions You were cared for by a hospitalist during your hospital stay. If you have any questions about your discharge medications or the care you received while you were in the hospital after you are discharged, you can call the unit and asked to speak with the hospitalist on call if the hospitalist that took care of you is not available. Once you are discharged, your primary care physician will handle any further medical issues. Please note that NO REFILLS for any discharge medications will be authorized once you are discharged, as it is imperative that you return to your primary care physician (or establish a relationship with a primary care physician if you do not have one) for your aftercare needs so that they can reassess your need for medications and monitor your lab values.  Discharge Instructions    Amb Referral to Cardiac Rehabilitation   Complete by:  As directed    Diagnosis:   Coronary Stents PTCA NSTEMI Heart Failure (see criteria below if ordering Phase II)     Heart Failure Type:  Chronic Systolic   Diet - low sodium heart healthy   Complete by:  As directed    Increase activity slowly   Complete by:  As directed    Increase activity slowly   Complete by:  As directed      Allergies as of 09/19/2017      Reactions   Sulfonamide Derivatives       Medication List    STOP taking these medications   aspirin EC 325 MG tablet   famotidine 20 MG tablet Commonly known as:  PEPCID     TAKE these medications   amiodarone  200 MG tablet Commonly known as:  PACERONE Take 1 tablet (200 mg total) by mouth 2 (two) times daily for 5 days.   amiodarone 200 MG tablet Commonly known as:  PACERONE Take 1 tablet (200 mg total) by mouth daily. Start taking on:  09/24/2017   apixaban 5 MG Tabs tablet Commonly known as:  ELIQUIS Take 1 tablet (5 mg total) by mouth 2 (two) times daily.   atorvastatin 40 MG tablet Commonly known as:  LIPITOR Take 40 mg by mouth at bedtime.   carvedilol 12.5 MG tablet Commonly known as:  COREG Take 12.5 mg by mouth at bedtime.   clopidogrel 75 MG tablet Commonly known as:  PLAVIX Take 1 tablet (75 mg total) by mouth daily with breakfast.   spironolactone 25 MG tablet Commonly known as:  ALDACTONE Take 25 mg by mouth at bedtime.      Allergies  Allergen Reactions  . Sulfonamide Derivatives    Follow-up Information  Azalee Course, [place] Follow up on 09/26/2017.   Specialties:  Cardiology, Radiology Why:  at 10:00 AM -this is one of Dr. Michaelle Copas  PA's no appts available in [place] or [place].  please keep this appt.  We do not want you to wait too long to be seen.   Contact information: [address] [place] [address] [phone]        Lyn Records, MD Follow up.   Specialty:  Cardiology Why:  the office will call with date and time Contact information: [address] [place] [address] [phone]        follow up with primary care doctor for hospital discharge follow up. Follow up.        [facility] Follow up.   Specialty:  Home Health Services Why:  phsycial therapy, and registerd nurse Contact information: [address] [place] [address] [phone]            The results of significant diagnostics from this hospitalization (including imaging, microbiology, ancillary and laboratory) are listed below for reference.    Significant Diagnostic Studies: US Renal  Result Date:  October 10, 2017 CLINICAL DATA:  Chronic renal disease EXAM: RENAL / URINARY TRACT ULTRASOUND COMPLETE COMPARISON:  None. FINDINGS: Right Kidney: Length: 10.3 cm. Echogenicity and renal cortical thickness are within normal limits. No perinephric fluid or hydronephrosis visualized. There is a cyst arising eccentrically from the lower pole left kidney measuring 4.8 x 4.4 x 3.7 cm. No sonographically demonstrable calculus or ureterectasis. Left Kidney: Length: 11.5 cm. Echogenicity and renal cortical thickness are within normal limits. No perinephric fluid or hydronephrosis visualized. There is a cyst arising from upper to mid left kidney measuring 1.4 x 1.3 x 1.3 cm. No sonographically demonstrable calculus or ureterectasis. Bladder: Appears normal for degree of bladder distention. IMPRESSION: Cyst in each kidney, considerably larger on the right than on the left. Study otherwise unremarkable. Electronically Signed   By: Bretta Bang III M.D.   On: October 10, 2017 08:39   Dg Chest Portable 1 View  Result Date: 09/16/2017 CLINICAL DATA:  Tachycardia and chest pain EXAM: PORTABLE CHEST 1 VIEW COMPARISON:  Sep 06, 2013 FINDINGS: There is no edema or consolidation. Heart is upper normal in size with pulmonary vascularity normal. Patient is status post coronary artery bypass grafting. No pneumothorax. No adenopathy. No bone lesions. IMPRESSION: No edema or consolidation.  Heart upper normal in size. Electronically Signed   By: Bretta Bang III M.D.   On: 09/16/2017 17:10    Microbiology: Recent Results (from the past 240 hour(s))  MRSA PCR Screening     Status: None   Collection Time: 2017-10-10 11:48 AM  Result Value Ref Range Status   MRSA by PCR NEGATIVE NEGATIVE Final    Comment:        The GeneXpert MRSA Assay (FDA approved for NASAL specimens only), is one component of a comprehensive MRSA colonization surveillance program. It is not intended to diagnose MRSA infection nor to guide or monitor  treatment for MRSA infections. Performed at [facility], [address], [place], [place] [address]      Labs: Basic Metabolic Panel: Recent Labs  Lab 09/16/17 1647 09/16/17 2125 10-10-2017 0521 09/18/17 0444  NA 137  --  139 138  K 4.9  --  4.4 4.1  CL 104  --  107 107  CO2 21*  --  25 22  GLUCOSE 212*  --  96  116*  BUN 23*  --  22* 15  CREATININE 1.78* 1.64* 1.54* 1.51*  CALCIUM 9.2  --  8.4* 8.2*  MG 2.1  --   --   --   PHOS 4.4  --   --   --    Liver Function Tests: Recent Labs  Lab 09/16/17 1647 09/17/17 0521  AST 91* 49*  ALT 92* 70*  ALKPHOS 50 45  BILITOT 1.9* 1.1  PROT 6.8 5.6*  ALBUMIN 3.8 3.2*   No results for input(s): LIPASE, AMYLASE in the last 168 hours. No results for input(s): AMMONIA in the last 168 hours. CBC: Recent Labs  Lab 09/16/17 1647 09/16/17 2125 09/17/17 0521 09/18/17 0444  WBC 12.0* 10.6* 10.4 11.0*  NEUTROABS 8.3*  --   --   --   HGB 15.9 14.0 13.3 13.2  HCT 47.5 43.5 41.0 41.4  MCV 93.9 94.2 94.3 95.0  PLT 185 150 140* 124*   Cardiac Enzymes: Recent Labs  Lab 09/16/17 1647 09/16/17 2125 09/17/17 0246 09/17/17 0833  TROPONINI 0.03* 0.67* 1.30* 1.17*   BNP: BNP (last 3 results) No results for input(s): BNP in the last 8760 hours.  ProBNP (last 3 results) No results for input(s): PROBNP in the last 8760 hours.  CBG: Recent Labs  Lab 09/17/17 0526 09/17/17 0748 09/17/17 1250 09/17/17 1559 09/17/17 2044  GLUCAP 97 89 102* 120* 125*       Signed:  Albertine Grates MD, PhD  [facility] 09/19/2017, 9:32 PM

## 2017-09-19 NOTE — Progress Notes (Signed)
Paged Dr. Craige Cotta about patient B/P's running in the 80's/60's range awaiting orders.  New order for NS bolus 250 ml.  Will monitor.

## 2017-09-19 NOTE — Progress Notes (Signed)
Progress Note  Patient Name: Harold English Date of Encounter: 09/19/2017  Primary Cardiologist:New patient  Subjective   No chest pain or SOB.He is hypotensive but states that it is his baseline.  Inpatient Medications    Scheduled Meds: . apixaban  5 mg Oral BID  . carvedilol  12.5 mg Oral QHS  . clopidogrel  75 mg Oral Q breakfast  . famotidine  20 mg Oral QHS  . insulin aspart  0-9 Units Subcutaneous Q4H  . nicotine  7 mg Transdermal Daily  . rosuvastatin  20 mg Oral q1800  . sodium chloride flush  3 mL Intravenous Q12H   Continuous Infusions: . sodium chloride    . amiodarone 30 mg/hr (09/19/17 0515)   PRN Meds: sodium chloride, acetaminophen **OR** acetaminophen, ondansetron **OR** ondansetron (ZOFRAN) IV, sodium chloride flush   Vital Signs    Vitals:   09/19/17 0545 09/19/17 0555 09/19/17 0700 09/19/17 0800  BP:  (!) 87/64 (!) 83/60 (!) 83/65  Pulse:   69   Resp:   15 15  Temp:   98.2 F (36.8 C)   TempSrc:   Oral   SpO2:      Weight: 219 lb 12.8 oz (99.7 kg)     Height:        Intake/Output Summary (Last 24 hours) at 09/19/2017 0912 Last data filed at 09/19/2017 0800 Gross per 24 hour  Intake 5373.53 ml  Output 1100 ml  Net 4273.53 ml   Filed Weights   09/17/17 1939 09/18/17 0400 09/19/17 0545  Weight: 214 lb 8.1 oz (97.3 kg) 216 lb 7.9 oz (98.2 kg) 219 lb 12.8 oz (99.7 kg)   Telemetry    SR - Personally Reviewed  ECG    SSR, RBBB - Personally Reviewed  Physical Exam   GEN: No acute distress.   Neck: No JVD Cardiac: RRR, no murmurs, rubs, or gallops.  Respiratory: Clear to auscultation bilaterally. GI: Soft, nontender, non-distended  MS: No edema; No deformity. Neuro:  Nonfocal  Psych: Normal affect   Labs    Chemistry Recent Labs  Lab 09/16/17 1647 09/16/17 2125 09/17/17 0521 09/18/17 0444  NA 137  --  139 138  K 4.9  --  4.4 4.1  CL 104  --  107 107  CO2 21*  --  25 22  GLUCOSE 212*  --  96 116*  BUN 23*  --  22* 15   CREATININE 1.78* 1.64* 1.54* 1.51*  CALCIUM 9.2  --  8.4* 8.2*  PROT 6.8  --  5.6*  --   ALBUMIN 3.8  --  3.2*  --   AST 91*  --  49*  --   ALT 92*  --  70*  --   ALKPHOS 50  --  45  --   BILITOT 1.9*  --  1.1  --   GFRNONAA 36* 40* 43* 44*  GFRAA 42* 46* 50* 51*  ANIONGAP 12  --  7 9    Hematology Recent Labs  Lab 09/16/17 2125 09/17/17 0521 09/18/17 0444  WBC 10.6* 10.4 11.0*  RBC 4.62 4.35 4.36  HGB 14.0 13.3 13.2  HCT 43.5 41.0 41.4  MCV 94.2 94.3 95.0  MCH 30.3 30.6 30.3  MCHC 32.2 32.4 31.9  RDW 13.9 14.0 13.8  PLT 150 140* 124*   Cardiac Enzymes Recent Labs  Lab 09/16/17 1647 09/16/17 2125 09/17/17 0246 09/17/17 0833  TROPONINI 0.03* 0.67* 1.30* 1.17*    Recent Labs  Lab 09/16/17  1644  TROPIPOC 0.03    BNPNo results for input(s): BNP, PROBNP in the last 168 hours.   DDimer No results for input(s): DDIMER in the last 168 hours.   Radiology    No results found.  Cardiac Studies   Echo: 09/18/2017  - Left ventricle: The cavity size was normal. There was mild   concentric hypertrophy. Systolic function was severely reduced.   The estimated ejection fraction was in the range of 20% to 25%.   Diffuse hypokinesis with apical akinesis. Doppler parameters are   consistent with high ventricular filling pressure. - Aortic valve: Transvalvular velocity was within the normal range.   There was no stenosis. There was no regurgitation. - Mitral valve: Transvalvular velocity was within the normal range.   There was no evidence for stenosis. There was mild regurgitation. - Left atrium: The atrium was moderately dilated. - Right ventricle: The cavity size was normal. Wall thickness was   normal. Systolic function was normal. - Right atrium: The atrium was severely dilated. - Tricuspid valve: There was mild regurgitation. - Pulmonary arteries: Systolic pressure was mildly to moderately   increased. PA peak pressure: 49 mm Hg (S).  Cath: 09/19/2017   1.  Severe native 3 vessel CAD with total occlusion of the LAD, total occlusion of the RCA, and total occlusion of the OM branches of the circumflex.  2. S/P CABG with continued patency of the sequential vein graft to the intermediate and OM branches, collateralizing the LAD and RCA distributions 3. Severe stenosis of the proximal LCx, treated successfully with DES implantation (3.0x15 mm Resolute Onyx) 4. Elevated LVEDP with known severe LV systolic dysfunction  Recommend: Recommend to resume Apixaban, at currently prescribed dose and frequency, today, 09/18/2017. Recommend antiplatelet therapy of Clopidogrel  QD for 6 months.    Patient Profile     77 y.o. male   Assessment & Plan    1. Paroxysmal atrial fibrillation-patient presented in atrial fibrillation with rapid ventricular response.  He was ultimately cardioverted in the emergency room.  Continue amiodarone and transition to oral formulation tomorrow.  Check echocardiogram and TSH. CHADSvasc 5 on 11/20/17.  Start apixaban 5 mg twice daily today, switch amiodarone drip to 200 mg po BID x 5 days, then 200 mg po daily. 2. Non-ST elevation myocardial infarction-patient has ruled in.  S/P cath yesterday, S/P CABG with continued patency of the sequential vein graft to the intermediate and OM branches, collateralizing the LAD and RCA distributions, Severe stenosis of the proximal LCx, treated successfully with DES implantation (3.0x15 mm Resolute Onyx). Stable left radial access site, good peripheral pulses. 3. We will treat with aspirin, beta-blockade and statin. 4. Ischemic cardiomyopathy-patient has a known ischemic cardiomyopathy. LVEF 20-25%. continue beta-blocker. Unable to add ARBgiven hypotension.  Will need reassessment in 3 months and if ejection fraction less than 35% would need to readdress ICD. 5. Chronic stage III kidney disease-gentle hydration prior to cardiac catheterization.  Follow renal function closely after  procedure. 6. Tobacco abuse-patient counseled on discontinuing. 7. History of hyperlipidemia-add Crestor 20 mg daily.  I have not added 40 mg given mild elevation in liver functions and addition of amiodarone. 8. Elevated liver functions-improving.  Discharge today, we will arrange follow up in our clinic.  For questions or updates, please contact [facility] Please consult [email or web address] for contact info under Cardiology/STEMI.     Signed, Tobias Alexander, MD  09/19/2017, 9:12 AM

## 2017-09-19 NOTE — Care Management Note (Addendum)
Case Management Note  Patient Details  Name: Harold English MRN: [id number] Date of Birth: 10/13/1940  Subjective/Objective:                    Action/Plan:   PTA independent from home with wife.  Pt has PCP and denied barriers with obtaining meds.  Pt has [facility] services coming into the home to help his wife.  Pt is interested in [facility] and PT - pt chose [facility] - agency contacted and referral accepted.  Pt will discharge home on Eliquis - CM provided free Eliquis card  - CM submitted benefits.  CM contacted pharmacy of choice [facility] [address] in [place] and was informed that pharmacy can fill medication.   Expected Discharge Date:  09/19/17               Expected Discharge Plan:  Home w Home Health Services  In-House Referral:     Discharge planning Services  CM Consult  Post Acute Care Choice:    Choice offered to:  Patient  DME Arranged:    DME Agency:     HH Arranged:  RN, PT HH Agency:     Status of Service:  In process, will continue to follow  If discussed at Long Length of Stay Meetings, dates discussed:    Additional Comments: Eliquis Benefit Check ELIQUIS 2.5 MG BID  COVER- YES  CO-PAY- $ 33.60  Q/L TWO PILL PER DAYS  TIER- 2 DRUG  PRIOR APPROVAL- NO   PREFERRED PHARMACY : [facility]   Cherylann Parr, RN 09/19/2017, 3:09 PM

## 2017-09-19 NOTE — Progress Notes (Signed)
Paged Dr. Craige Cotta no changes in patient B/P after receiving NS bolus 250 ml patient is asymptomatic.  Awaiting further instruction.  Will continue to monitor.

## 2017-09-19 NOTE — Progress Notes (Signed)
Patient refused lab draw this morning verbalized refusal to both the lab tech and nurse.

## 2017-09-19 NOTE — Discharge Instructions (Signed)
Follow up with Lisabeth Devoid, PA one of Dr. Michaelle Copas PAs to make sure you are stable and we will arrange an appt with Dr. Katrinka Blazing in 2-3 months.    Information on my medicine - ELIQUIS (apixaban)  This medication education was reviewed with me or my healthcare representative as part of my discharge preparation.  The pharmacist that spoke with me during my hospital stay was:  Mosetta Anis, [facility]  Why was Eliquis prescribed for you? Eliquis was prescribed for you to reduce the risk of a blood clot forming that can cause a stroke if you have a medical condition called atrial fibrillation (a type of irregular heartbeat).  What do You need to know about Eliquis ? Take your Eliquis TWICE DAILY - one tablet in the morning and one tablet in the evening with or without food. If you have difficulty swallowing the tablet whole please discuss with your pharmacist how to take the medication safely.  Take Eliquis exactly as prescribed by your doctor and DO NOT stop taking Eliquis without talking to the doctor who prescribed the medication.  Stopping may increase your risk of developing a stroke.  Refill your prescription before you run out.  After discharge, you should have regular check-up appointments with your healthcare provider that is prescribing your Eliquis.  In the future your dose may need to be changed if your kidney function or weight changes by a significant amount or as you get older.  What do you do if you miss a dose? If you miss a dose, take it as soon as you remember on the same day and resume taking twice daily.  Do not take more than one dose of ELIQUIS at the same time to make up a missed dose.  Important Safety Information A possible side effect of Eliquis is bleeding. You should call your healthcare provider right away if you experience any of the following: ? Bleeding from an injury or your nose that does not stop. ? Unusual colored urine (red or dark brown) or unusual colored stools  (red or black). ? Unusual bruising for unknown reasons. ? A serious fall or if you hit your head (even if there is no bleeding).  Some medicines may interact with Eliquis and might increase your risk of bleeding or clotting while on Eliquis. To help avoid this, consult your healthcare provider or pharmacist prior to using any new prescription or non-prescription medications, including herbals, vitamins, non-steroidal anti-inflammatory drugs (NSAIDs) and supplements.  This website has more information on Eliquis (apixaban): http://www.eliquis.com/eliquis/home

## 2017-09-19 NOTE — Care Management Important Message (Signed)
Important Message  Patient Details  Name: Harold English MRN: [id number] Date of Birth: [birth year]   Medicare Important Message Given:  Yes    Rheana Casebolt P Ahley Bulls 09/19/2017, 2:08 PM

## 2017-09-19 NOTE — Progress Notes (Signed)
Removed PIVs and pt received discharge instructions with his niece. They understood it well. Home care arranged. Pt took his all belongings. HS [employer]

## 2017-09-21 ENCOUNTER — Inpatient Hospital Stay (HOSPITAL_COMMUNITY)
Admission: EM | Admit: 2017-09-21 | Discharge: 2017-09-22 | DRG: 149 | Disposition: A | Discharge status: Home Health | Payer: Medicare Other | Attending: Internal Medicine | Admitting: Internal Medicine

## 2017-09-21 ENCOUNTER — Encounter (HOSPITAL_COMMUNITY): Payer: Self-pay

## 2017-09-21 ENCOUNTER — Emergency Department (HOSPITAL_COMMUNITY): Payer: Medicare Other

## 2017-09-21 ENCOUNTER — Inpatient Hospital Stay (HOSPITAL_COMMUNITY): Payer: Medicare Other

## 2017-09-21 ENCOUNTER — Other Ambulatory Visit: Payer: Self-pay

## 2017-09-21 DIAGNOSIS — R42 Dizziness and giddiness: Principal | ICD-10-CM | POA: Diagnosis present

## 2017-09-21 DIAGNOSIS — Z7902 Long term (current) use of antithrombotics/antiplatelets: Secondary | ICD-10-CM

## 2017-09-21 DIAGNOSIS — Z7901 Long term (current) use of anticoagulants: Secondary | ICD-10-CM | POA: Diagnosis not present

## 2017-09-21 DIAGNOSIS — E782 Mixed hyperlipidemia: Secondary | ICD-10-CM | POA: Diagnosis not present

## 2017-09-21 DIAGNOSIS — I130 Hypertensive heart and chronic kidney disease with heart failure and stage 1 through stage 4 chronic kidney disease, or unspecified chronic kidney disease: Secondary | ICD-10-CM | POA: Diagnosis present

## 2017-09-21 DIAGNOSIS — I25119 Atherosclerotic heart disease of native coronary artery with unspecified angina pectoris: Secondary | ICD-10-CM | POA: Diagnosis not present

## 2017-09-21 DIAGNOSIS — Z8249 Family history of ischemic heart disease and other diseases of the circulatory system: Secondary | ICD-10-CM | POA: Diagnosis not present

## 2017-09-21 DIAGNOSIS — I255 Ischemic cardiomyopathy: Secondary | ICD-10-CM | POA: Diagnosis not present

## 2017-09-21 DIAGNOSIS — Z955 Presence of coronary angioplasty implant and graft: Secondary | ICD-10-CM | POA: Diagnosis not present

## 2017-09-21 DIAGNOSIS — K219 Gastro-esophageal reflux disease without esophagitis: Secondary | ICD-10-CM | POA: Diagnosis present

## 2017-09-21 DIAGNOSIS — I429 Cardiomyopathy, unspecified: Secondary | ICD-10-CM | POA: Diagnosis not present

## 2017-09-21 DIAGNOSIS — Z882 Allergy status to sulfonamides status: Secondary | ICD-10-CM | POA: Diagnosis not present

## 2017-09-21 DIAGNOSIS — I252 Old myocardial infarction: Secondary | ICD-10-CM | POA: Diagnosis not present

## 2017-09-21 DIAGNOSIS — N183 Chronic kidney disease, stage 3 (moderate): Secondary | ICD-10-CM | POA: Diagnosis not present

## 2017-09-21 DIAGNOSIS — R0789 Other chest pain: Secondary | ICD-10-CM

## 2017-09-21 DIAGNOSIS — I5022 Chronic systolic (congestive) heart failure: Secondary | ICD-10-CM | POA: Diagnosis present

## 2017-09-21 DIAGNOSIS — F17210 Nicotine dependence, cigarettes, uncomplicated: Secondary | ICD-10-CM | POA: Diagnosis not present

## 2017-09-21 DIAGNOSIS — I483 Typical atrial flutter: Secondary | ICD-10-CM | POA: Diagnosis not present

## 2017-09-21 DIAGNOSIS — I452 Bifascicular block: Secondary | ICD-10-CM | POA: Diagnosis present

## 2017-09-21 DIAGNOSIS — I10 Essential (primary) hypertension: Secondary | ICD-10-CM | POA: Diagnosis present

## 2017-09-21 DIAGNOSIS — I2510 Atherosclerotic heart disease of native coronary artery without angina pectoris: Secondary | ICD-10-CM | POA: Diagnosis not present

## 2017-09-21 DIAGNOSIS — F17200 Nicotine dependence, unspecified, uncomplicated: Secondary | ICD-10-CM | POA: Diagnosis present

## 2017-09-21 HISTORY — DX: Dizziness and giddiness: R42

## 2017-09-21 HISTORY — DX: Typical atrial flutter: I48.3

## 2017-09-21 LAB — CBC WITH DIFFERENTIAL/PLATELET
BASOS ABS: 0 10*3/uL (ref 0.0–0.1)
Basophils Relative: 0 %
Eosinophils Absolute: 0.1 10*3/uL (ref 0.0–0.7)
Eosinophils Relative: 1 %
HCT: 44.2 % (ref 39.0–52.0)
Hemoglobin: 14.7 g/dL (ref 13.0–17.0)
LYMPHS ABS: 2.5 10*3/uL (ref 0.7–4.0)
LYMPHS PCT: 22 %
MCH: 31.1 pg (ref 26.0–34.0)
MCHC: 33.3 g/dL (ref 30.0–36.0)
MCV: 93.4 fL (ref 78.0–100.0)
Monocytes Absolute: 1.1 10*3/uL — ABNORMAL HIGH (ref 0.1–1.0)
Monocytes Relative: 10 %
NEUTROS ABS: 7.3 10*3/uL (ref 1.7–7.7)
Neutrophils Relative %: 67 %
Platelets: 178 10*3/uL (ref 150–400)
RBC: 4.73 MIL/uL (ref 4.22–5.81)
RDW: 13.9 % (ref 11.5–15.5)
WBC: 11.1 10*3/uL — AB (ref 4.0–10.5)

## 2017-09-21 LAB — COMPREHENSIVE METABOLIC PANEL
ALT: 32 U/L (ref 17–63)
AST: 18 U/L (ref 15–41)
Albumin: 3.3 g/dL — ABNORMAL LOW (ref 3.5–5.0)
Alkaline Phosphatase: 45 U/L (ref 38–126)
Anion gap: 8 (ref 5–15)
BILIRUBIN TOTAL: 1.2 mg/dL (ref 0.3–1.2)
BUN: 12 mg/dL (ref 6–20)
CO2: 21 mmol/L — ABNORMAL LOW (ref 22–32)
CREATININE: 1.2 mg/dL (ref 0.61–1.24)
Calcium: 9 mg/dL (ref 8.9–10.3)
Chloride: 109 mmol/L (ref 101–111)
GFR calc Af Amer: >60 mL/min (ref >60)
GFR, EST NON AFRICAN AMERICAN: 58 mL/min — AB (ref >60)
Glucose, Bld: 187 mg/dL — ABNORMAL HIGH (ref 65–99)
Potassium: 4.2 mmol/L (ref 3.5–5.1)
Sodium: 138 mmol/L (ref 135–145)
TOTAL PROTEIN: 6.4 g/dL — AB (ref 6.5–8.1)

## 2017-09-21 LAB — TROPONIN I: TROPONIN I: 0.15 ng/mL — AB (ref <0.03)

## 2017-09-21 LAB — MAGNESIUM: Magnesium: 2 mg/dL (ref 1.7–2.4)

## 2017-09-21 LAB — I-STAT TROPONIN, ED: Troponin i, poc: 0.12 ng/mL (ref 0.00–0.08)

## 2017-09-21 LAB — TSH: TSH: 3.315 u[IU]/mL (ref 0.350–4.500)

## 2017-09-21 LAB — PHOSPHORUS: Phosphorus: 3.4 mg/dL (ref 2.5–4.6)

## 2017-09-21 LAB — LIPASE, BLOOD: LIPASE: 32 U/L (ref 11–51)

## 2017-09-21 MED ORDER — ATORVASTATIN CALCIUM 40 MG PO TABS
40.0000 mg | ORAL_TABLET | Freq: Every day | ORAL | Status: DC
  Administered 2017-09-21: 40 mg via ORAL
  Filled 2017-09-21: qty 1

## 2017-09-21 MED ORDER — APIXABAN 5 MG PO TABS
5.0000 mg | ORAL_TABLET | Freq: Two times a day (BID) | ORAL | Status: DC
  Administered 2017-09-21 – 2017-09-22 (×3): 5 mg via ORAL
  Filled 2017-09-21 (×3): qty 1

## 2017-09-21 MED ORDER — SPIRONOLACTONE 25 MG PO TABS: 25.0000 mg | ORAL_TABLET | Freq: Every day | ORAL | Status: DC

## 2017-09-21 MED ORDER — ACETAMINOPHEN 650 MG RE SUPP: 650.0000 mg | Freq: Four times a day (QID) | RECTAL | Status: DC | PRN

## 2017-09-21 MED ORDER — ONDANSETRON HCL 4 MG PO TABS: 4.0000 mg | ORAL_TABLET | Freq: Four times a day (QID) | ORAL | Status: DC | PRN

## 2017-09-21 MED ORDER — ONDANSETRON HCL 4 MG/2ML IJ SOLN
4.0000 mg | Freq: Once | INTRAMUSCULAR | Status: AC
  Administered 2017-09-21: 4 mg via INTRAVENOUS
  Filled 2017-09-21: qty 2

## 2017-09-21 MED ORDER — MECLIZINE HCL 12.5 MG PO TABS
12.5000 mg | ORAL_TABLET | Freq: Three times a day (TID) | ORAL | Status: DC
  Administered 2017-09-21 – 2017-09-22 (×4): 12.5 mg via ORAL
  Filled 2017-09-21 (×4): qty 1

## 2017-09-21 MED ORDER — PNEUMOCOCCAL VAC POLYVALENT 25 MCG/0.5ML IJ INJ
0.5000 mL | INJECTION | INTRAMUSCULAR | Status: DC
  Filled 2017-09-21: qty 0.5

## 2017-09-21 MED ORDER — NICOTINE 14 MG/24HR TD PT24
14.0000 mg | MEDICATED_PATCH | Freq: Every day | TRANSDERMAL | Status: DC
  Administered 2017-09-21 – 2017-09-22 (×2): 14 mg via TRANSDERMAL
  Filled 2017-09-21 (×2): qty 1

## 2017-09-21 MED ORDER — ONDANSETRON HCL 4 MG/2ML IJ SOLN: 4.0000 mg | Freq: Four times a day (QID) | INTRAMUSCULAR | Status: DC | PRN

## 2017-09-21 MED ORDER — ACETAMINOPHEN 325 MG PO TABS: 650.0000 mg | ORAL_TABLET | Freq: Four times a day (QID) | ORAL | Status: DC | PRN

## 2017-09-21 MED ORDER — CARVEDILOL 12.5 MG PO TABS
12.5000 mg | ORAL_TABLET | Freq: Two times a day (BID) | ORAL | Status: DC
  Administered 2017-09-21 – 2017-09-22 (×2): 12.5 mg via ORAL
  Filled 2017-09-21 (×2): qty 1

## 2017-09-21 MED ORDER — CLOPIDOGREL BISULFATE 75 MG PO TABS
75.0000 mg | ORAL_TABLET | Freq: Every day | ORAL | Status: DC
  Administered 2017-09-21 – 2017-09-22 (×2): 75 mg via ORAL
  Filled 2017-09-21 (×2): qty 1

## 2017-09-21 MED ORDER — MECLIZINE HCL 12.5 MG PO TABS
25.0000 mg | ORAL_TABLET | Freq: Once | ORAL | Status: AC
  Administered 2017-09-21: 25 mg via ORAL
  Filled 2017-09-21: qty 2

## 2017-09-21 MED ORDER — AMIODARONE HCL 200 MG PO TABS
200.0000 mg | ORAL_TABLET | Freq: Two times a day (BID) | ORAL | Status: DC
  Administered 2017-09-21 – 2017-09-22 (×3): 200 mg via ORAL
  Filled 2017-09-21 (×3): qty 1

## 2017-09-21 MED ORDER — SODIUM CHLORIDE 0.9 % IV SOLN
INTRAVENOUS | Status: AC
  Administered 2017-09-21: 15:00:00 via INTRAVENOUS

## 2017-09-21 NOTE — ED Notes (Signed)
CRITICAL VALUE ALERT  Critical Value:  Troponin 0.12  Date & Time Notied:  09/21/17 & 0607 hrs  Provider Notified: Dr. Wilkie Aye  Orders Received/Actions taken: N/A

## 2017-09-21 NOTE — ED Provider Notes (Signed)
[facility] EMERGENCY DEPARTMENT Provider Note   CSN: [id number] Arrival date & time: 09/21/17  0547     History   Chief Complaint Chief Complaint  Patient presents with  . Chest Pain    HPI Harold English is a 77 y.o. male.  HPI  This is a 77 year old male with a history of coronary artery disease, ischemic cardiomyopathy, recent history of ventricular tachyarrhythmia requiring admission and recent stent placement on 09/18/2017 who presents with nausea and dizziness.  Patient reports onset of room spinning dizziness, nausea, and vomiting prior to arrival.  He also had some anterior chest pain that was nonradiating.  He is currently chest pain-free.  He does report some improvement of his dizziness but states "it is better when my eyes are shut."  He states the dizziness was worse with position changes.  He denies any abdominal pain.  Denies any fevers or shortness of breath.  He reports taking his medications as prescribed.  No history of vertigo or similar dizziness in the past.  Denies any focal weakness, numbness, strokelike symptoms.  Past Medical History:  Diagnosis Date  . CAD (coronary artery disease)   . GERD (gastroesophageal reflux disease)   . Hyperlipidemia   . Hypertension   . Ischemic cardiomyopathy   . MI (myocardial infarction) (HCC)   . Renal insufficiency     Patient Active Problem List   Diagnosis Date Noted  . Chest pain 09/16/2017  . Ventricular tachyarrhythmia (HCC) 09/16/2017  . ISCHEMIC CARDIOMYOPATHY 01/10/2010  . Mixed hyperlipidemia 11/17/2009  . TOBACCO ABUSE 11/17/2009  . Essential hypertension, benign 11/17/2009  . CORONARY ATHEROSCLEROSIS NATIVE CORONARY ARTERY 11/17/2009  . UNSPECIFIED SECONDARY CARDIOMYOPATHY 11/17/2009  . SHOULDER PAIN 06/07/2009  . IMPINGEMENT SYNDROME 06/07/2009    Past Surgical History:  Procedure Laterality Date  . ADENOIDECTOMY    . BACK SURGERY    . CARDIAC SURGERY    . CORONARY ARTERY BYPASS GRAFT    .  CORONARY STENT INTERVENTION N/A 09/18/2017   Procedure: CORONARY STENT INTERVENTION;  Surgeon: Tonny Bollman, MD;  Location: [facility] INVASIVE CV LAB;  Service: Cardiovascular;  Laterality: N/A;  . LEFT HEART CATH AND CORS/GRAFTS ANGIOGRAPHY N/A 09/18/2017   Procedure: LEFT HEART CATH AND CORS/GRAFTS ANGIOGRAPHY;  Surgeon: Tonny Bollman, MD;  Location: [facility] INVASIVE CV LAB;  Service: Cardiovascular;  Laterality: N/A;  . TONSILLECTOMY          Home Medications    Prior to Admission medications   Medication Sig Start Date End Date Taking? Authorizing Provider  amiodarone (PACERONE) 200 MG tablet Take 1 tablet (200 mg total) by mouth 2 (two) times daily for 5 days. 09/19/17 09/24/17  Albertine Grates, MD  amiodarone (PACERONE) 200 MG tablet Take 1 tablet (200 mg total) by mouth daily. 09/24/17   Albertine Grates, MD  apixaban (ELIQUIS) 5 MG TABS tablet Take 1 tablet (5 mg total) by mouth 2 (two) times daily. 09/19/17   Albertine Grates, MD  atorvastatin (LIPITOR) 40 MG tablet Take 40 mg by mouth at bedtime.  08/31/13   [provider]  carvedilol (COREG) 12.5 MG tablet Take 12.5 mg by mouth at bedtime.  08/31/13   [provider]  clopidogrel (PLAVIX) 75 MG tablet Take 1 tablet (75 mg total) by mouth daily with breakfast. 09/19/17   Albertine Grates, MD  spironolactone (ALDACTONE) 25 MG tablet Take 25 mg by mouth at bedtime.  08/31/13   [provider]    Family History Family History  Problem Relation Age  of Onset  . CAD Father     Social History Social History   Tobacco Use  . Smoking status: Current Every Day Smoker    Packs/day: 0.75    Years: 0.00    Pack years: 0.00    Types: Cigarettes  . Smokeless tobacco: Never Used  Substance Use Topics  . Alcohol use: Yes    Comment: 1 drink every 3 days   . Drug use: No     Allergies   Sulfonamide derivatives   Review of Systems Review of Systems  Constitutional: Negative for fever.  Respiratory: Negative for shortness of breath.     Cardiovascular: Positive for chest pain.  Gastrointestinal: Positive for nausea and vomiting. Negative for abdominal pain and diarrhea.  Genitourinary: Negative for dysuria.  Neurological: Positive for dizziness. Negative for speech difficulty, weakness, light-headedness and numbness.  All other systems reviewed and are negative.    Physical Exam Updated Vital Signs BP 125/84   Pulse (!) 114   Temp 97.6 F (36.4 C) (Oral)   Resp 17   Ht  (1.88 m)   Wt 99.8 kg (220 lb)   SpO2 94%   BMI 28.25 kg/m   Physical Exam  Constitutional: He is oriented to person, place, and time. He appears well-developed and well-nourished.  Overweight, uncomfortable appearing but nontoxic  HENT:  Head: Normocephalic and atraumatic.  Eyes: Pupils are equal, round, and reactive to light. EOM are normal.  No nystagmus noted  Neck: Neck supple.  Cardiovascular: Normal rate, normal heart sounds and normal pulses. A regularly irregular rhythm present.  No murmur heard. Midline sternotomy scar, irregularly irregular rhythm  Pulmonary/Chest: Effort normal and breath sounds normal. No respiratory distress. He has no wheezes.  Abdominal: Soft. Bowel sounds are normal. There is no tenderness. There is no rebound.  Musculoskeletal: He exhibits no edema.  Lymphadenopathy:    He has no cervical adenopathy.  Neurological: He is alert and oriented to person, place, and time.  Cranial nerves II through XII intact, 5 out of 5 strength in all 4 extremities, no dysmetria to finger-nose-finger  Skin: Skin is warm and dry.  Psychiatric: He has a normal mood and affect.  Nursing note and vitals reviewed.    ED Treatments / Results  Labs (all labs ordered are listed, but only abnormal results are displayed) Labs Reviewed  CBC WITH DIFFERENTIAL/PLATELET - Abnormal; Notable for the following components:      Result Value   WBC 11.1 (*)    Monocytes Absolute 1.1 (*)    All other components within normal  limits  COMPREHENSIVE METABOLIC PANEL - Abnormal; Notable for the following components:   CO2 21 (*)    Glucose, Bld 187 (*)    Total Protein 6.4 (*)    Albumin 3.3 (*)    GFR calc non Af Amer 58 (*)    All other components within normal limits  I-STAT TROPONIN, ED - Abnormal; Notable for the following components:   Troponin i, poc 0.12 (*)    All other components within normal limits  LIPASE, BLOOD  TROPONIN I    EKG EKG Interpretation  Date/Time:  Friday Sep 21 2017 05:50:39 EDT Ventricular Rate:  120 PR Interval:    QRS Duration: 168 QT Interval:  425 QTC Calculation: 601 R Axis:   -95 Text Interpretation:  Atrial flutter with predominant 2:1 AV block RBBB and LAFB Baseline wander in lead(s) II III aVF Similar to prior  Confirmed by Ross Marcus ([id number])  on 09/21/2017 5:54:34 AM   Radiology Dg Chest Portable 1 View  Result Date: 09/21/2017 CLINICAL DATA:  Acute onset of generalized chest pain, abdominal pain and nausea. EXAM: PORTABLE CHEST 1 VIEW COMPARISON:  Chest radiograph performed 09/16/2017 FINDINGS: The lungs are well-aerated. Mild right basilar airspace opacity could reflect atelectasis or mild pneumonia. Mild vascular congestion is noted. There is no evidence of pleural effusion or pneumothorax. The cardiomediastinal silhouette is normal in size. The patient is status post median sternotomy. No acute osseous abnormalities are seen. IMPRESSION: Mild right basilar airspace opacity could reflect atelectasis or mild pneumonia. Mild vascular congestion noted. Electronically Signed   By: Roanna Raider M.D.   On: 09/21/2017 06:25    Procedures Procedures (including critical care time)  Medications Ordered in ED Medications  meclizine (ANTIVERT) tablet 25 mg (25 mg Oral Given 09/21/17 0614)  ondansetron (ZOFRAN) injection 4 mg (4 mg Intravenous Given 09/21/17 8119)     Initial Impression / Assessment and Plan / ED Course  I have reviewed the triage vital signs and  the nursing notes.  Pertinent labs & imaging results that were available during my care of the patient were reviewed by me and considered in my medical decision making (see chart for details).  Clinical Course as of Sep 21 645  Fri Sep 21, 2017  1478 Reports some improvement with meclizine and Zofran.  Initial work-up is largely reassuring.  Stat troponin is 0.12.  Normal troponin several days ago in the hospital was greater than 1.  Will send a lab troponin to verify downtrending.  Patient remains chest pain-free.  Heart rate is in the low 100s.  Chest x-ray does show atelectasis versus pneumonia.  Patient denies any fevers or cough. Favor atelectasis.   [CH]  0636   Favor atelectasis.   [CH]    Clinical Course User Index [CH] Odel Schmid, Mayer Masker, MD    Patient presents with dizziness, vomiting, episode of chest pain that self resolved.  He has a recent complicated medical history including recent stent placement and tachyarrhythmia.  He is on Eliquis.  He is currently in atrial flutter with a rate in the low 100s.  He is ill-appearing but nontoxic.  Neuro exam is normal.  His history is most suggestive of vertigo which I favor peripheral especially given that he is on Eliquis.  He has no history of vertigo however.  Patient was given Zofran and meclizine.  Regarding his chest pain, his EKG shows atrial flutter without ischemic changes.  Chest x-ray shows no evidence of pneumothorax or pneumonia.  Initial troponin is 0.12 as above.  We will send a lab troponin as well.  Given his recent history and recurrent chest discomfort at home, would favor admission for observation and serial troponins.  Additionally, continue to monitoring for worsening vertiginous symptoms although he did have some improvement with meclizine and Zofran.  This was discussed with Dr. Sharl Ma.  Final Clinical Impressions(s) / ED Diagnoses   Final diagnoses:  Vertigo  Atypical chest pain  Typical atrial flutter [facility])    ED  Discharge Orders    None       Shon Baton, MD 09/21/17 [phone]

## 2017-09-21 NOTE — [place] FL2 (Signed)
[place] MEDICAID FL2 LEVEL OF CARE SCREENING TOOL     IDENTIFICATION  Patient Name: Harold English Birthdate: 09-01-1940 Sex: male Admission Date (Current Location): 09/21/2017  [facility] and [place] Number:  [employer] and Address:  [facility],  [address] [place] [address]      Provider Number: [id number]  Attending Physician Name and Address:  Vassie Loll, MD  Relative Name and Phone Number:       Current Level of Care: Hospital Recommended Level of Care: Skilled Nursing Facility Prior Approval Number:    Date Approved/Denied:   PASRR Number:    Discharge Plan: SNF    Current Diagnoses: Patient Active Problem List   Diagnosis Date Noted  . Vertigo 09/21/2017  . Typical atrial flutter (HCC) 09/21/2017  . Chest pain 09/16/2017  . Ventricular tachyarrhythmia (HCC) 09/16/2017  . Ischemic cardiomyopathy 01/10/2010  . Mixed hyperlipidemia 11/17/2009  . TOBACCO ABUSE 11/17/2009  . Essential hypertension, benign 11/17/2009  . CORONARY ATHEROSCLEROSIS NATIVE CORONARY ARTERY 11/17/2009  . UNSPECIFIED SECONDARY CARDIOMYOPATHY 11/17/2009  . SHOULDER PAIN 06/07/2009  . IMPINGEMENT SYNDROME 06/07/2009    Orientation RESPIRATION BLADDER Height & Weight     Self, Time, Situation, Place  Normal Continent Weight: 220 lb (99.8 kg) Height:   (188 cm)  BEHAVIORAL SYMPTOMS/MOOD NEUROLOGICAL BOWEL NUTRITION STATUS      Continent (heart healthy)  AMBULATORY STATUS COMMUNICATION OF NEEDS Skin   Limited Assist Verbally Normal                       Personal Care Assistance Level of Assistance  Bathing, Feeding, Dressing Bathing Assistance: Limited assistance Feeding assistance: Independent Dressing Assistance: Limited assistance     Functional Limitations Info  Sight, Hearing, Speech Sight Info: Adequate Hearing Info: Adequate Speech Info: Adequate    SPECIAL CARE FACTORS FREQUENCY  PT (By licensed PT)     PT Frequency:  5x/week              Contractures Contractures Info: Not present    Additional Factors Info  Allergies, Code Status Code Status Info: Full Code Allergies Info: Sulfonamide Derivatives           Current Medications (09/21/2017):  This is the current hospital active medication list Current Facility-Administered Medications  Medication Dose Route Frequency Provider Last Rate Last Dose  . 0.9 %  sodium chloride infusion   Intravenous Continuous Vassie Loll, MD 50 mL/hr at 09/21/17 1505    . acetaminophen (TYLENOL) tablet 650 mg  650 mg Oral Q6H PRN Vassie Loll, MD       Or  . acetaminophen (TYLENOL) suppository 650 mg  650 mg Rectal Q6H PRN Vassie Loll, MD      . amiodarone (PACERONE) tablet 200 mg  200 mg Oral BID Vassie Loll, MD   200 mg at 09/21/17 1327  . apixaban (ELIQUIS) tablet 5 mg  5 mg Oral BID Vassie Loll, MD   5 mg at 09/21/17 1327  . atorvastatin (LIPITOR) tablet 40 mg  40 mg Oral QHS Vassie Loll, MD      . carvedilol (COREG) tablet 12.5 mg  12.5 mg Oral BID WC Vassie Loll, MD      . clopidogrel (PLAVIX) tablet 75 mg  75 mg Oral Q breakfast Vassie Loll, MD   75 mg at 09/21/17 1327  . meclizine (ANTIVERT) tablet 12.5 mg  12.5 mg Oral TID Vassie Loll, MD   12.5 mg at 09/21/17 1327  .  nicotine (NICODERM CQ - dosed in mg/24 hours) patch 14 mg  14 mg Transdermal Daily Vassie Loll, MD   14 mg at 09/21/17 1505  . ondansetron (ZOFRAN) tablet 4 mg  4 mg Oral Q6H PRN Vassie Loll, MD       Or  . ondansetron [facility]) injection 4 mg  4 mg Intravenous Q6H PRN Vassie Loll, MD      . Melene Muller ON 09/22/2017] pneumococcal 23 valent vaccine (PNU-IMMUNE) injection 0.5 mL  0.5 mL Intramuscular Tomorrow-1000 Vassie Loll, MD         Discharge Medications: Please see discharge summary for a list of discharge medications.  Relevant Imaging Results:  Relevant Lab Results:   Additional Information SSN [id number] Juleen China, LCSW

## 2017-09-21 NOTE — Consult Note (Addendum)
Cardiology Consult    Patient ID: Harold English; [id number]; 10-19-1940   Admit date: 09/21/2017 Date of Consult: 09/21/2017  Primary Care Provider: Estanislado Pandy, MD Primary Cardiologist: Previously followed by Dr. Diona Browner in 2011; Evaluated by Dr. Jens Som during recent admission but has scheduled OP follow-up with Dr. Katrinka Blazing (?)   Patient Profile    Harold English is a 77 y.o. male with past medical history of CAD (s/p CABG in 2004), ischemic cardiomyopathy (EF 25-30% by echo in 2011, at 20 to 25% by repeat echocardiogram in 08/2017), PAF (on Eliquis), HTN, HLD, Stage 3 CKD, and tobacco use who is being seen today for the evaluation of chest pain and elevated troponin at the request of Dr. Gwenlyn Perking.   History of Present Illness    Harold English was recently admitted to [facility] from 5/26 - 09/19/2017 for evaluation of chest pain and dizziness.  He was noted to be in atrial fibrillation with RVR upon arrival to the ED and was subsequently started on amiodarone and ultimately experienced cardioversion. A repeat echocardiogram was obtained and showed his EF was further reduced to 20 to 25% and cyclic troponin values were elevated and peaked at 1.30. Therefore, a cardiac catheterization was performed on 09/18/2017 and showed severe native three-vessel CAD with patency of sequential SVG-RI-OM with severe stenosis of the proximal LCx which was treated successfully with DES placement. RCA was 100% occluded but the RPDA was filled by collaterals from the LCx and posterior lateral was filled by collaterals from the third marginal. The day following the procedure, he denied any chest pain or dyspnea on exertion and was continued on Eliquis 5 mg twice daily with Plavix 75 mg daily being added. EKG did show that he was back in atrial flutter. He was also continued on PO Amiodarone 200 mg twice daily for the next 5 days with subsequent decrease to 200 mg daily afterwards.  He presented back to the ED  earlier this morning for evaluation of chest pain, abdominal pain, and nausea. In talking with the patient, he reports feeling like "his body was going to stop" this morning. Notes an overall vague discomfort along his chest and abdominal region but denies any symptoms similar to what brought him to the hospital on 5/26. Also notes dizziness and says he feels like he is going to pass out but denies any actual syncope. No recent dyspnea, orthopnea, or PND. Has experienced some lower extremity edema.  Initial labs show WBC 11.1, Hgb 14.7, platelets 178, Na+ 138, K+ 4.2, and creatinine 1.20.  Initial I-STAT troponin at 0.12 with repeat Troponin I at 0.15. EKG shows atrial flutter, HR 120.  CXR shows mild right basilar opacity which could reflect atelectasis or mild pneumonia. CT Head showing no acute intracranial abnormalities.   Past Medical History:  Diagnosis Date  . CAD (coronary artery disease)   . GERD (gastroesophageal reflux disease)   . Hyperlipidemia   . Hypertension   . Ischemic cardiomyopathy   . MI (myocardial infarction) (HCC)   . Renal insufficiency     Past Surgical History:  Procedure Laterality Date  . ADENOIDECTOMY    . BACK SURGERY    . CARDIAC SURGERY    . CORONARY ARTERY BYPASS GRAFT    . CORONARY STENT INTERVENTION N/A 09/18/2017   Procedure: CORONARY STENT INTERVENTION;  Surgeon: Tonny Bollman, MD;  Location: [facility] INVASIVE CV LAB;  Service: Cardiovascular;  Laterality: N/A;  . LEFT HEART CATH AND CORS/GRAFTS ANGIOGRAPHY  N/A 09/18/2017   Procedure: LEFT HEART CATH AND CORS/GRAFTS ANGIOGRAPHY;  Surgeon: Tonny Bollman, MD;  Location: [facility] INVASIVE CV LAB;  Service: Cardiovascular;  Laterality: N/A;  . TONSILLECTOMY       Home Medications:  Prior to Admission medications   Medication Sig Start Date End Date Taking? Authorizing Provider  amiodarone (PACERONE) 200 MG tablet Take 1 tablet (200 mg total) by mouth 2 (two) times daily for 5 days. 09/19/17 09/24/17  Albertine Grates, MD    apixaban (ELIQUIS) 5 MG TABS tablet Take 1 tablet (5 mg total) by mouth 2 (two) times daily. 09/19/17   Albertine Grates, MD  atorvastatin (LIPITOR) 40 MG tablet Take 40 mg by mouth at bedtime.  08/31/13   [provider]  carvedilol (COREG) 12.5 MG tablet Take 12.5 mg by mouth at bedtime.  08/31/13   [provider]  clopidogrel (PLAVIX) 75 MG tablet Take 1 tablet (75 mg total) by mouth daily with breakfast. 09/19/17   Albertine Grates, MD  spironolactone (ALDACTONE) 25 MG tablet Take 25 mg by mouth at bedtime.  08/31/13   [provider]    Allergies:    Allergies  Allergen Reactions  . Sulfonamide Derivatives     Social History:   Social History   Socioeconomic History  . Marital status: Married    Spouse name: Not on file  . Number of children: Not on file  . Years of education: Not on file  . Highest education level: Not on file  Occupational History    Comment: Retired  Engineer, production  . Financial resource strain: Not on file  . Food insecurity:    Worry: Not on file    Inability: Not on file  . Transportation needs:    Medical: Not on file    Non-medical: Not on file  Tobacco Use  . Smoking status: Current Every Day Smoker    Packs/day: 0.75    Years: 0.00    Pack years: 0.00    Types: Cigarettes  . Smokeless tobacco: Never Used  Substance and Sexual Activity  . Alcohol use: Yes    Comment: 1 drink every 3 days   . Drug use: No  . Sexual activity: Not on file  Lifestyle  . Physical activity:    Days per week: Not on file    Minutes per session: Not on file  . Stress: Not on file  Relationships  . Social connections:    Talks on phone: Not on file    Gets together: Not on file    Attends religious service: Not on file    Active member of club or organization: Not on file    Attends meetings of clubs or organizations: Not on file    Relationship status: Not on file  . Intimate partner violence:    Fear of current or ex partner: Not on file     Emotionally abused: Not on file    Physically abused: Not on file    Forced sexual activity: Not on file  Other Topics Concern  . Not on file  Social History Narrative  . Not on file     Family History:    Family History  Problem Relation Age of Onset  . CAD Father       Review of Systems    General:  No chills, fever, night sweats or weight changes. Positive for dizziness.  Cardiovascular:  No dyspnea on exertion, edema, orthopnea, palpitations, paroxysmal nocturnal dyspnea. Positive for chest  pain.  Dermatological: No rash, lesions/masses Respiratory: No cough, dyspnea Urologic: No hematuria, dysuria Abdominal:   No nausea, vomiting, diarrhea, bright red blood per rectum, melena, or hematemesis. Positive for abdominal pain.  Neurologic:  No visual changes, wkns, changes in mental status.  All other systems reviewed and are otherwise negative except as noted above.  Physical Exam/Data    Vitals:   09/21/17 0700 09/21/17 0730 09/21/17 0800 09/21/17 0900  BP: 125/86 116/76 122/83 109/72  Pulse: 68 89 98 70  Resp: Temp:      TempSrc:      SpO2: 95% 97% 96% 96%  Weight:    220 lb (99.8 kg)  Height:     (1.88 m)   No intake or output data in the 24 hours ending 09/21/17 1056 Filed Weights   09/21/17 0552 09/21/17 0900  Weight: 220 lb (99.8 kg) 220 lb (99.8 kg)   Body mass index is 28.25 kg/m.   General: Pleasant, Caucasian male appearing in NAD Psych: Normal affect. Neuro: Alert and oriented X 3. Moves all extremities spontaneously. HEENT: Normal  Neck: Supple without bruits or JVD. Lungs:  Resp regular and unlabored, CTA. Heart: Irregularly irregular, no s3, s4, or murmurs. Abdomen: Soft, non-tender, non-distended, BS + x 4.  Extremities: No clubbing or cyanosis. 1+ pitting edema bilaterally. DP/PT/Radials 2+ and equal bilaterally.   Telemetry:  Telemetry was personally reviewed and demonstrates: Atrial flutter, HR in 80's to 90's.     Labs/Studies     Relevant CV Studies:  Echocardiogram: 09/18/2017 Study Conclusions  - Left ventricle: The cavity size was normal. There was mild   concentric hypertrophy. Systolic function was severely reduced.   The estimated ejection fraction was in the range of 20% to 25%.   Diffuse hypokinesis with apical akinesis. Doppler parameters are   consistent with high ventricular filling pressure. - Aortic valve: Transvalvular velocity was within the normal range.   There was no stenosis. There was no regurgitation. - Mitral valve: Transvalvular velocity was within the normal range.   There was no evidence for stenosis. There was mild regurgitation. - Left atrium: The atrium was moderately dilated. - Right ventricle: The cavity size was normal. Wall thickness was   normal. Systolic function was normal. - Right atrium: The atrium was severely dilated. - Tricuspid valve: There was mild regurgitation. - Pulmonary arteries: Systolic pressure was mildly to moderately   increased. PA peak pressure: 49 mm Hg (S).  Cardiac Catheterization: 09/18/2017 1. Severe native 3 vessel CAD with total occlusion of the LAD, total occlusion of the RCA, and total occlusion of the OM branches of the circumflex.  2. S/P CABG with continued patency of the sequential vein graft to the intermediate and OM branches, collateralizing the LAD and RCA distributions 3. Severe stenosis of the proximal LCx, treated successfully with DES implantation (3.0x15 mm Resolute Onyx) 4. Elevated LVEDP with known severe LV systolic dysfunction  Recommend: Recommend to resume Apixaban, at currently prescribed dose and frequency, today, 09/18/2017. Recommend antiplatelet therapy of Clopidogrel  QD for 6 months.  Laboratory Data:  Chemistry Recent Labs  Lab 09/17/17 0521 09/18/17 0444 09/21/17 0557  NA 139 138 138  K 4.4 4.1 4.2  CL 107 107 109  CO2 25 22 21*  GLUCOSE 96 116* 187*  BUN 22* 15 12   CREATININE 1.54* 1.51* 1.20  CALCIUM 8.4* 8.2* 9.0  GFRNONAA 43* 44* 58*  GFRAA 50* 51* >60  ANIONGAP 7 9  8    Recent Labs  Lab 09/16/17 1647 09/17/17 0521 09/21/17 0557  PROT 6.8 5.6* 6.4*  ALBUMIN 3.8 3.2* 3.3*  AST 91* 49* 18  ALT 92* 70* 32  ALKPHOS 50 45 45  BILITOT 1.9* 1.1 1.2   Hematology Recent Labs  Lab 09/17/17 0521 09/18/17 0444 09/21/17 0557  WBC 10.4 11.0* 11.1*  RBC 4.35 4.36 4.73  HGB 13.3 13.2 14.7  HCT 41.0 41.4 44.2  MCV 94.3 95.0 93.4  MCH 30.6 30.3 31.1  MCHC 32.4 31.9 33.3  RDW 14.0 13.8 13.9  PLT 140* 124* 178   Cardiac Enzymes Recent Labs  Lab 09/16/17 1647 09/16/17 2125 09/17/17 0246 09/17/17 0833 09/21/17 0557  TROPONINI 0.03* 0.67* 1.30* 1.17* 0.15*    Recent Labs  Lab 09/16/17 1644 09/21/17 0556  TROPIPOC 0.03 0.12*    BNPNo results for input(s): BNP, PROBNP in the last 168 hours.  DDimer No results for input(s): DDIMER in the last 168 hours.  Radiology/Studies:  Ct Head Wo Contrast  Result Date: 09/21/2017 CLINICAL DATA:  Persistent central vertigo EXAM: CT HEAD WITHOUT CONTRAST TECHNIQUE: Contiguous axial images were obtained from the base of the skull through the vertex without intravenous contrast. COMPARISON:  None. FINDINGS: Brain: No evidence of acute infarction, hemorrhage, hydrocephalus, extra-axial collection or mass lesion/mass effect. No finding in the brainstem, cisterns, or cerebellum to explain the history. Low density below the right putamen is likely dilated perivascular space. Vascular: Atherosclerotic calcification. Skull: No acute or aggressive finding. Sinuses/Orbits: Negative.  Clear mastoid and middle ears. IMPRESSION: Negative head CT. Electronically Signed   By: Marnee Spring M.D.   On: 09/21/2017 09:57   Dg Chest Portable 1 View  Result Date: 09/21/2017 CLINICAL DATA:  Acute onset of generalized chest pain, abdominal pain and nausea. EXAM: PORTABLE CHEST 1 VIEW COMPARISON:  Chest radiograph performed  09/16/2017 FINDINGS: The lungs are well-aerated. Mild right basilar airspace opacity could reflect atelectasis or mild pneumonia. Mild vascular congestion is noted. There is no evidence of pleural effusion or pneumothorax. The cardiomediastinal silhouette is normal in size. The patient is status post median sternotomy. No acute osseous abnormalities are seen. IMPRESSION: Mild right basilar airspace opacity could reflect atelectasis or mild pneumonia. Mild vascular congestion noted. Electronically Signed   By: Roanna Raider M.D.   On: 09/21/2017 06:25     Assessment & Plan    1. CAD/ Elevated Troponin  - the patient is s/p CABG in 2004 and was recently admitted for an NSTEMI with cath showing patency of sequential SVG-RI-OM with severe stenosis of the proximal LCx which was treated successfully with DES placement. RCA was 100% occluded but the RPDA was filled by collaterals from the LCx and posterior lateral was filled by collaterals from the third marginal.  Troponin values peaked at 1.30 during admission and repeat troponin this admission is at 0.15 which is likely down-trending from his recent ACS event.  - he denies any anginal symptoms similar to when he presented to the hospital with his recent NSTEMI. EKG shows no acute ischemic changes. No plans for further ischemic evaluation at this time.  - continue Plavix, BB, and statin therapy. Was not started on ASA after stent placement given the concurrent indication for anticoagulation.   2. Dizziness/ Presyncope - Presents with episodes of vague dizziness and presyncope but denies any actual syncopal events. Initial labs showed no acute electrolyte abnormalities. CT head with no acute intracranial abnormalities. Will check orthostatic vitals. Continue to follow on telemetry  this admission. No indication for a repeat echocardiogram as this was just obtained 3 days ago.  3. Chronic Systolic CHF/ Ischemic Cardiomyopathy - Patient has a known reduced EF  of 20 to 25% as noted by prior echocardiogram. He denies any specific dyspnea, orthopnea, or PND but does have lower extremity edema on examination. - Continue Carvedilol. Unclear why he was started on Spironolactone but not on ACE-I or ARB last admission. Will hold Spironolactone at this time given his soft BP. Consider starting low-dose ARB prior to discharge if BP allows.   4. Paroxysmal Atrial Flutter - He was diagnosed with atrial flutter last admission and experienced conversion to normal sinus rhythm with IV amiodarone but was back in atrial flutter on 09/19/2017.  Heart rates are currently well controlled in the 80's to 90's. Continue PTA Coreg and Amiodarone 200 mg twice daily.  May require further dose titration of Coreg or switching to Toprol-XL pending BP and HR response.  - He denies any evidence of active bleeding. Remains on Eliquis for anticoagulation.  5. HTN - BP has been well-controlled since admission but is currently soft at 109/72 and he has not yet received his morning medications. Will continue Carvedilol and hold Spironolactone at this time.  6. HLD - No recent FLP on file. Will recheck in AM. Currently on Atorvastatin 40 mg daily.  7. Stage 3 CKD - baseline creatinine 1.5 - 1.6. Improved to 1.20 this AM. Continue to follow.    For questions or updates, please contact [facility] Please consult [email or web address] for contact info under Cardiology/STEMI.  Signed, Ellsworth Lennox, PA-C 09/21/2017, 10:56 AM Pager: [phone]   Attending note:  Patient seen and examined.  I reviewed extensive records including recent hospitalization and cardiac testing, discussed this case with Ms. Iran Ouch PA-C.  Also spoke with the patient's niece (visiting from [place]).  Mr. Jacobsen just went home from the hospital having presented with NSTEMI and undergoing DES intervention to the proximal circumflex, otherwise to be managed medically with combination of native and graft disease  following previous CABG.  He was also found to have a cardiomyopathy with LVEF 20 to 25% as well as paroxysmal atrial flutter requiring cardioversion and initiation of amiodarone. He presents to the hospital now with recent complaints of dizziness and lightheadedness, nausea, a feeling that he is "shut down."  Does not report any angina symptoms similar to presentation with recent infarct and no palpitations.  On examination he is in no distress sitting on the side of the bed.  Systolic blood pressure is in the 120-160 range.  Telemetry which I personally reviewed shows atrial flutter with variable conduction, heart rate in the 100-120 range.  Lungs exhibit decreased breath sounds without wheezing.  Cardiac exam reveals indistinct PMI with irregularly irregular rhythm and no obvious S3.  He has 1+ ankle edema.  Lab work shows potassium 4.2, BUN 12, creatinine 1.2, normal AST and ALT, troponin I 0.15 which is trending down from recent hospital stay, hemoglobin 14.7, platelets 178.  Head CT reportedly negative without acute findings.  X-ray reports mild right basilar airspace opacity as well as interstitial edema.  I personally reviewed his ECG from May 31st which shows atrial flutter with right bundle branch block and left anterior fascicular block.  Of note, he was in atrial flutter on his prior tracing at [facility] from May 29.  Patient presents with constellation of symptoms of uncertain etiology at this point.  He may be symptomatic due to his  atrial flutter with faster rates, although was in this rhythm on May 29 while he was at [facility] as well.  He reports taking his medications including amiodarone and Eliquis.  The mildly elevated troponin I is trending down from recent stay and likely does not represent new ACS.  Would recommend checking orthostatic vital signs.  Resume oral load of amiodarone and continue Eliquis.  Continue and potentially uptitrate Coreg for heart rate control  depending on blood pressure.  Also continue Plavix with recent DES intervention.  Might be experiencing some nausea secondary to amiodarone, although LFTs are normal.  No indication for follow-up echocardiogram as yet in light of recent work-up.  Jonelle Sidle, M.D., F.A.C.C.

## 2017-09-21 NOTE — Clinical Social Work Note (Signed)
Clinical Social Work Assessment  Patient Details  Name: Harold English MRN: [id number] Date of Birth: May 01, 1940  Date of referral:  09/21/17               Reason for consult:  Facility Placement, Discharge Planning                Permission sought to share information with:  Oceanographer granted to share information::     Name::        Agency::  [facility]  Relationship::     Contact Information:     Housing/Transportation Living arrangements for the past 2 months:  Single Family Home Source of Information:  Other (Comment Required)(Niece) Patient Interpreter Needed:  None Criminal Activity/Legal Involvement Pertinent to Current Situation/Hospitalization:  No - Comment as needed Significant Relationships:  Siblings, Spouse, Other Family Members Lives with:  Spouse Do you feel safe going back to the place where you live?  Yes Need for family participation in patient care:  Yes (Comment)  Care giving concerns: Pt very weak and he is the primary caregiver for his wife who is undergoing treatment for cancer and who also has dementia.   Social Worker assessment / plan: Pt is a 77 year old discussed in Progression today. PT indicating pt will need SNF. MD stated pt has a cardiology consult pending and that he may need cardiology intervention prior to SNF determination. Pt sleeping at the time of LCSW visit today. Pt's niece is in the room and she participated in assessment. Pt's niece, Lorra Hals, is visiting from [place]. Pt and his wife do not have children. Pt is the primary caregiver for his wife who has cancer and dementia. Pt's sister and her son live in [place]. Pt's wife's sister lives in [place]. Some of these other family members are caring for pt's wife at this time.   Per Lorra Hals, pt had a heart attack last week and he was released from [facility] on 5/29 being told that he needed a defibrillator and that he was at high risk for sudden cardiac death  due to an EF of 20 percent. Pt's niece states that she feels pt needs further cardiology management and that the wouldn't even be able to participate in PT at a SNF due to his weakness related to his heart function. Lorra Hals does state that if he were to go to a SNF, he would want to stay in [place].   Lorra Hals states that she does not think pt and his wife are able to private pay for caregivers at home but that they would benefit from more assistance in the home.  Will hold off on SNF referrals until cardiology recommendations are documented. Will follow and assist as needed.  Employment status:  Retired Database administrator PT Recommendations:  Skilled Nursing Facility Information / Referral to community resources:     Patient/Family's Response to care: Family accepting of care. Pt sleeping. Not assessed.  Patient/Family's Understanding of and Emotional Response to Diagnosis, Current Treatment, and Prognosis: Per niece, pt and extended family have a good understanding of diagnosis. They are unclear as to why defibrillator placement was not arranged based on MD statements at the time of pt's dc from [facility] on 5/29. Pt and family are experiencing psychosocial stress related to pt's medical condition and the care needs of pt's wife. Support provided.  Emotional Assessment Appearance:  Appears stated age Attitude/Demeanor/Rapport:  Unable to Assess Affect (typically observed):  Unable to Assess  Orientation:  Oriented to Self, Oriented to Place, Oriented to  Time, Oriented to Situation Alcohol / Substance use:  Not Applicable Psych involvement (Current and /or in the community):  No (Comment)  Discharge Needs  Concerns to be addressed:  Discharge Planning Concerns Readmission within the last 30 days:  Yes Current discharge risk:  Chronically ill Barriers to Discharge:  Continued Medical Work up   [employer], LCSW 09/21/2017, 11:34 AM

## 2017-09-21 NOTE — Progress Notes (Signed)
Stated not feeling as dizzy as earlier.  No nausea and has been eating some.  Family and friends have been visiting today.

## 2017-09-21 NOTE — Progress Notes (Signed)
Patient has an in-grown toenail.  Toe is slightly swollen and reddened.  Note left for MD to access.

## 2017-09-21 NOTE — Care Management (Signed)
Patient is active with [facility]. Harold English of [facility] aware of admission. If patient discharges home, he will need resumption orders.

## 2017-09-21 NOTE — ED Notes (Signed)
Date and time results received: 09/21/17 0724   Test: Troponin  Critical Value: 0.15  Name of Provider Notified: [facility]

## 2017-09-21 NOTE — H&P (Addendum)
History and Physical    Harold English ZOX:[id number] DOB: 03-02-1941 DOA: 09/21/2017  PCP: Estanislado Pandy, MD   I have briefly reviewed patients previous medical reports in [facility].  Patient coming from: patient came from home.  Chief Complaint: dizziness and chest pain.   HPI: Harold English is a 77 y/o male with PMH significant for CAD ( s/p post recent PCI), HTN, HLD, tobacco abuse, GERD, renal insufficiency and ischemic cardiomyopathy; who presented to ED complaining of chest pain and dizziness. Patient reported feeling dizzy, room spinning sensation, with associated nausea and vomiting. Also reported experiencing chest pain; located anteriorly, non-radiated, 5-6/10 in intensity, lasting approx 30-40 minutes, associated with the vomiting episode, reported symptoms are nothing similar to what he experienced with recent ACS. No fever, no chills, no hematemesis, no hematuria, no melena, no hematochezia, no dysuria, no focal motor deficits or any other complaints.   ED Course: EKG show atrial flutter, troponin elevated to 0.12-.015; CXR with mild right basilar opacity and CT head no showing abnormalities. TR[facility] ask to admit patient for further evaluation and management.  Review of Systems:  All other systems reviewed and apart from HPI, are negative.  Past Medical History:  Diagnosis Date  . CAD (coronary artery disease)   . GERD (gastroesophageal reflux disease)   . Hyperlipidemia   . Hypertension   . Ischemic cardiomyopathy   . MI (myocardial infarction) (HCC)   . Renal insufficiency     Past Surgical History:  Procedure Laterality Date  . ADENOIDECTOMY    . BACK SURGERY    . CARDIAC SURGERY    . CORONARY ARTERY BYPASS GRAFT    . CORONARY STENT INTERVENTION N/A 09/18/2017   Procedure: CORONARY STENT INTERVENTION;  Surgeon: Tonny Bollman, MD;  Location: [facility] INVASIVE CV LAB;  Service: Cardiovascular;  Laterality: N/A;  . LEFT HEART CATH AND CORS/GRAFTS ANGIOGRAPHY N/A  09/18/2017   Procedure: LEFT HEART CATH AND CORS/GRAFTS ANGIOGRAPHY;  Surgeon: Tonny Bollman, MD;  Location: [facility] INVASIVE CV LAB;  Service: Cardiovascular;  Laterality: N/A;  . TONSILLECTOMY      Social History  reports that he has been smoking cigarettes.  He has been smoking about 0.75 packs per day for the past 0.00 years. He has never used smokeless tobacco. He reports that he drinks alcohol. He reports that he does not use drugs.  Allergies  Allergen Reactions  . Sulfonamide Derivatives     Family History  Problem Relation Age of Onset  . CAD Father      Prior to Admission medications   Medication Sig Start Date End Date Taking? Authorizing Provider  amiodarone (PACERONE) 200 MG tablet Take 1 tablet (200 mg total) by mouth 2 (two) times daily for 5 days. 09/19/17 09/24/17  Albertine Grates, MD  apixaban (ELIQUIS) 5 MG TABS tablet Take 1 tablet (5 mg total) by mouth 2 (two) times daily. 09/19/17   Albertine Grates, MD  atorvastatin (LIPITOR) 40 MG tablet Take 40 mg by mouth at bedtime.  08/31/13   [provider]  carvedilol (COREG) 12.5 MG tablet Take 12.5 mg by mouth at bedtime.  08/31/13   [provider]  clopidogrel (PLAVIX) 75 MG tablet Take 1 tablet (75 mg total) by mouth daily with breakfast. 09/19/17   Albertine Grates, MD  spironolactone (ALDACTONE) 25 MG tablet Take 25 mg by mouth at bedtime.  08/31/13   [provider]    Physical Exam: Vitals:   09/21/17 0700 09/21/17 0730 09/21/17 0800  09/21/17 0900  BP: 125/86 116/76 122/83 109/72  Pulse: 68 89 98 70  Resp: 20 14 14 16   Temp:      TempSrc:      SpO2: 95% 97% 96% 96%  Weight:    99.8 kg (220 lb)  Height:    6\' 2"  (1.88 m)    Constitutional: afebrile, no CP currently, still feeling dizzy, no further nausea, no vomiting and denying abd pain. Eyes: PERTLA, lids and conjunctivae normal, no icterus. ENMT: Mucous membranes slightly dry on exam. Posterior pharynx clear of any exudate or lesions. No thrush Neck:  supple, no masses, no thyromegaly, no JVD Respiratory: clear to auscultation bilaterally, no wheezing, no frank crackles. Normal respiratory effort. No accessory muscle use.  Cardiovascular: irregular, no rubs, no gallops, no murmurs. Trace extremity edema. 2+ pedal pulses. No carotid bruits.  Abdomen: No distension, no tenderness, no masses palpated. No hepatosplenomegaly. Bowel sounds normal.  Musculoskeletal: no clubbing / cyanosis. No joint deformity upper and lower extremities. Good ROM, no contractures. Normal muscle tone.  Skin: no rashes, lesions, ulcers. No induration Neurologic: CN 2-12 grossly intact. Sensation intact, DTR normal. Strength 5/5 in all 4 limbs.  Psychiatric: Normal judgment and insight. Alert and oriented x 3. Normal mood.   Labs on Admission: I have personally reviewed following labs and imaging studies  CBC: Recent Labs  Lab 09/16/17 1647 09/16/17 2125 09/17/17 0521 09/18/17 0444 09/21/17 0557  WBC 12.0* 10.6* 10.4 11.0* 11.1*  NEUTROABS 8.3*  --   --   --  7.3  HGB 15.9 14.0 13.3 13.2 14.7  HCT 47.5 43.5 41.0 41.4 44.2  MCV 93.9 94.2 94.3 95.0 93.4  PLT 185 150 140* 124* 178   Basic Metabolic Panel: Recent Labs  Lab 09/16/17 1647 09/16/17 2125 09/17/17 0521 09/18/17 0444 09/21/17 0557  NA 137  --  139 138 138  K 4.9  --  4.4 4.1 4.2  CL 104  --  107 107 109  CO2 21*  --  25 22 21*  GLUCOSE 212*  --  96 116* 187*  BUN 23*  --  22* 15 12  CREATININE 1.78* 1.64* 1.54* 1.51* 1.20  CALCIUM 9.2  --  8.4* 8.2* 9.0  MG 2.1  --   --   --   --   PHOS 4.4  --   --   --   --    Liver Function Tests: Recent Labs  Lab 09/16/17 1647 09/17/17 0521 09/21/17 0557  AST 91* 49* 18  ALT 92* 70* 32  ALKPHOS 50 45 45  BILITOT 1.9* 1.1 1.2  PROT 6.8 5.6* 6.4*  ALBUMIN 3.8 3.2* 3.3*   Coagulation Profile: Recent Labs  Lab 09/16/17 1647  INR 1.21   Cardiac Enzymes: Recent Labs  Lab 09/16/17 1647 09/16/17 2125 09/17/17 0246 09/17/17 0833  09/21/17 0557  TROPONINI 0.03* 0.67* 1.30* 1.17* 0.15*   HbA1C: No results for input(s): HGBA1C in the last 72 hours. CBG: Recent Labs  Lab 09/17/17 0526 09/17/17 0748 09/17/17 1250 09/17/17 1559 09/17/17 2044  GLUCAP 97 89 102* 120* 125*   Urine analysis: No results found for: COLORURINE, APPEARANCEUR, LABSPEC, PHURINE, GLUCOSEU, HGBUR, BILIRUBINUR, KETONESUR, PROTEINUR, UROBILINOGEN, NITRITE, LEUKOCYTESUR   Radiological Exams on Admission: Ct Head Wo Contrast  Result Date: 09/21/2017 CLINICAL DATA:  Persistent central vertigo EXAM: CT HEAD WITHOUT CONTRAST TECHNIQUE: Contiguous axial images were obtained from the base of the skull through the vertex without intravenous contrast. COMPARISON:  None. FINDINGS: Brain: No  evidence of acute infarction, hemorrhage, hydrocephalus, extra-axial collection or mass lesion/mass effect. No finding in the brainstem, cisterns, or cerebellum to explain the history. Low density below the right putamen is likely dilated perivascular space. Vascular: Atherosclerotic calcification. Skull: No acute or aggressive finding. Sinuses/Orbits: Negative.  Clear mastoid and middle ears. IMPRESSION: Negative head CT. Electronically Signed   By: Marnee SpringJonathon  Watts M.D.   On: 09/21/2017 09:57   Dg Chest Portable 1 View  Result Date: 09/21/2017 CLINICAL DATA:  Acute onset of generalized chest pain, abdominal pain and nausea. EXAM: PORTABLE CHEST 1 VIEW COMPARISON:  Chest radiograph performed 09/16/2017 FINDINGS: The lungs are well-aerated. Mild right basilar airspace opacity could reflect atelectasis or mild pneumonia. Mild vascular congestion is noted. There is no evidence of pleural effusion or pneumothorax. The cardiomediastinal silhouette is normal in size. The patient is status post median sternotomy. No acute osseous abnormalities are seen. IMPRESSION: Mild right basilar airspace opacity could reflect atelectasis or mild pneumonia. Mild vascular congestion noted.  Electronically Signed   By: Roanna RaiderJeffery  Chang M.D.   On: 09/21/2017 06:25    EKG: Independently reviewed. positive atrial flutter. Normal axis.   Assessment/Plan 1-vertigo: She denies hearing impairment and is not having any focal motor deficits.  -CT scan of the head negative for acute intracranial abnormalities -Unlikely ischemic in nature given chronic use of Plavix and Eliquis. -Improvement in symptoms with the use of meclizine. -Most likely associated with vestibular dysfunction. -Physical therapy for vestibular training has been requested -continue low dose meclizine TID -gentle IVF's -monitor on telemetry  -cardiology consulted and inputs appreciated. -check TSH, PO4 and Mag level  2-Mixed hyperlipidemia -will continue statins   3-chest pain: -currently CP free -most likely residual discomfort from cardioversion  -no ischemic changes on EKG or telemetry -per cardiology continue current therapy -no anticipated further ischemic work up.  4-TOBACCO ABUSE -I have discussed tobacco cessation with the patient.  I have counseled the patient regarding the negative impacts of continued tobacco use including but not limited to lung cancer, COPD, and cardiovascular disease.  I have discussed alternatives to tobacco and modalities that may help facilitate tobacco cessation including but not limited to biofeedback, hypnosis, and medications.  Total time spent with tobacco counseling was 5 minutes. -Nicotine patch ordered.  5-Essential hypertension, benign -Stable  -Continue current antihypertensive regimen.   6-ischemic cardiomyopathy -With recent DES placement -continue b-blocker -continue plavix -follow cardiology service rec's -continue heart healthy diet   7-Typical atrial flutter (HCC) -CHADsVASC score 4 -continue eliquis -continue pacerone and b-blocker for rate control  8-HLD -continue statins   Time: 70 minutes   DVT prophylaxis: Patient on Eliquis (for  anticoagulation which would be sufficient to protect against DVT). Code Status: Full code Family Communication: Authorized to discuss medical condition in front of her friend from church. Disposition Plan: To be determined.  Follow recommendation by physical therapy and discharge to the next venue once vertigo stabilized. Consults called: Cardiology service Admission status: LOS more than 2 midnights, telemetry bed, inpatient status.   Vassie Lollarlos Chrishelle Zito MD [facility] Pager [phone]  If 7PM-7AM, please contact night-coverage [email or web address] Password [email or web address]  09/21/2017, 2:03 PM

## 2017-09-21 NOTE — Plan of Care (Signed)
  Problem: Acute Rehab PT Goals(only PT should resolve) Goal: Pt Will Go Supine/Side To Sit Outcome: Progressing Flowsheets (Taken 09/21/2017 1436) Pt will go Supine/Side to Sit: with modified independence Goal: Patient Will Transfer Sit To/From Stand Outcome: Progressing Flowsheets (Taken 09/21/2017 1436) Patient will transfer sit to/from stand: with supervision Goal: Pt Will Transfer Bed To Chair/Chair To Bed Outcome: Progressing Flowsheets (Taken 09/21/2017 1436) Pt will Transfer Bed to Chair/Chair to Bed: with supervision Goal: Pt Will Ambulate Outcome: Progressing Flowsheets (Taken 09/21/2017 1436) Pt will Ambulate: 50 feet;with supervision;with rolling walker  2:37 PM, 09/21/17 Ocie Bob, MPT Physical Therapist with [facility] [phone] office [phone] mobile phone

## 2017-09-21 NOTE — ED Triage Notes (Addendum)
Pt in by [facility] for chest pain , abd pain and nausea.  Pt denies pain at this time. Pt had a stent placed on May 26th.

## 2017-09-21 NOTE — Evaluation (Signed)
Physical Therapy Evaluation Patient Details Name: Harold English MRN: [id number] DOB: 1941/01/07 Today's Date: 09/21/2017   History of Present Illness  This is a 77 year old male with a history of coronary artery disease, ischemic cardiomyopathy, recent history of ventricular tachyarrhythmia requiring admission and recent stent placement on 09/18/2017 who presents with nausea and dizziness.  Patient reports onset of room spinning dizziness, nausea, and vomiting prior to arrival.  He also had some anterior chest pain that was nonradiating.  He is currently chest pain-free.  He does report some improvement of his dizziness but states "it is better when my eyes are shut."  He states the dizziness was worse with position changes.  He denies any abdominal pain.  Denies any fevers or shortness of breath.  He reports taking his medications as prescribed.  No history of vertigo or similar dizziness in the past.  Denies any focal weakness, numbness, strokelike symptoms.    Clinical Impression  Patient presents fatigued and limited to sitting up at bedside only.  Patient c/o 2/5 dizziness while lying in bed that increased to severe 5/5 once sitting up at bedside.  Patient declined to attempt sit to stands or walking due to not feeling well and went back to bed - RN & MD informed.  Patient will benefit from continued physical therapy in hospital and recommended venue below to increase strength, balance, endurance for safe ADLs and gait.    Follow Up Recommendations SNF;Supervision/Assistance - 24 hour    Equipment Recommendations  None recommended by PT    Recommendations for Other Services       Precautions / Restrictions Precautions Precautions: Fall Restrictions Weight Bearing Restrictions: No      Mobility  Bed Mobility Overal bed mobility: Needs Assistance Bed Mobility: Supine to Sit;Sit to Supine     Supine to sit: Supervision Sit to supine: Supervision   General bed mobility comments:  slow labored movement  Transfers                    Ambulation/Gait                Stairs            Wheelchair Mobility    Modified Rankin (Stroke Patients Only)       Balance Overall balance assessment: Needs assistance Sitting-balance support: Feet supported;No upper extremity supported Sitting balance-Leahy Scale: Good                                       Pertinent Vitals/Pain Pain Assessment: No/denies pain    Home Living Family/patient expects to be discharged to:: Private residence Living Arrangements: Spouse/significant other Available Help at Discharge: Other (Comment)(Per patient's neice, no one) Type of Home: House Home Access: Stairs to enter Entrance Stairs-Rails: Right Entrance Stairs-Number of Steps: 2 Home Layout: Two level;Able to live on main level with bedroom/bathroom Home Equipment: Gilmer Mor - single point;Walker - 2 wheels Additional Comments: patient does not go up stairs and usually takes care of his spouse who is recovering from cancer    Prior Function Level of Independence: Independent               Hand Dominance        Extremity/Trunk Assessment   Upper Extremity Assessment Upper Extremity Assessment: Generalized weakness    Lower Extremity Assessment Lower Extremity Assessment: Generalized weakness    Cervical / Trunk  Assessment Cervical / Trunk Assessment: Normal  Communication   Communication: No difficulties  Cognition Arousal/Alertness: Awake/alert Behavior During Therapy: WFL for tasks assessed/performed Overall Cognitive Status: Within Functional Limits for tasks assessed                                        General Comments      Exercises     Assessment/Plan    PT Assessment Patient needs continued PT services  PT Problem List Decreased strength;Decreased activity tolerance;Decreased balance;Decreased mobility       PT Treatment Interventions Gait  training;Stair training;Functional mobility training;Therapeutic activities;Patient/family education    PT Goals (Current goals can be found in the Care Plan section)  Acute Rehab PT Goals Patient Stated Goal: return home capable of taking care of spouse PT Goal Formulation: With patient/family Time For Goal Achievement: 10/05/17 Potential to Achieve Goals: Fair    Frequency Min 3X/week   Barriers to discharge        Co-evaluation               AM-PAC PT "6 Clicks" Daily Activity  Outcome Measure Difficulty turning over in bed (including adjusting bedclothes, sheets and blankets)?: None Difficulty moving from lying on back to sitting on the side of the bed? : None Difficulty sitting down on and standing up from a chair with arms (e.g., wheelchair, bedside commode, etc,.)?: A Little Help needed moving to and from a bed to chair (including a wheelchair)?: A Little Help needed walking in hospital room?: A Little Help needed climbing 3-5 steps with a railing? : A Little 6 Click Score: 20    End of Session   Activity Tolerance: Patient limited by fatigue(Patient limited by dizziness) Patient left: in bed;with call bell/phone within reach;with family/visitor present Nurse Communication: Mobility status;Other (comment)(RN notified that patient limited to sitting only due to c/o dizziness) PT Visit Diagnosis: Unsteadiness on feet (R26.81);Other abnormalities of gait and mobility (R26.89);Muscle weakness (generalized) (M62.81)    Time: 1610-9604 PT Time Calculation (min) (ACUTE ONLY): 19 min   Charges:   PT Evaluation $PT Eval Moderate Complexity: 1 Mod PT Treatments $Therapeutic Activity: 8-22 mins   PT G Codes:        2:34 PM, 2017/10/19 Ocie Bob, MPT Physical Therapist with [facility] [phone] office [phone] mobile phone

## 2017-09-22 DIAGNOSIS — E782 Mixed hyperlipidemia: Secondary | ICD-10-CM | POA: Diagnosis not present

## 2017-09-22 DIAGNOSIS — Z8249 Family history of ischemic heart disease and other diseases of the circulatory system: Secondary | ICD-10-CM | POA: Diagnosis not present

## 2017-09-22 DIAGNOSIS — F17200 Nicotine dependence, unspecified, uncomplicated: Secondary | ICD-10-CM

## 2017-09-22 DIAGNOSIS — R42 Dizziness and giddiness: Principal | ICD-10-CM

## 2017-09-22 DIAGNOSIS — R0789 Other chest pain: Secondary | ICD-10-CM

## 2017-09-22 DIAGNOSIS — I255 Ischemic cardiomyopathy: Secondary | ICD-10-CM | POA: Diagnosis not present

## 2017-09-22 DIAGNOSIS — I252 Old myocardial infarction: Secondary | ICD-10-CM | POA: Diagnosis not present

## 2017-09-22 DIAGNOSIS — I10 Essential (primary) hypertension: Secondary | ICD-10-CM

## 2017-09-22 DIAGNOSIS — I130 Hypertensive heart and chronic kidney disease with heart failure and stage 1 through stage 4 chronic kidney disease, or unspecified chronic kidney disease: Secondary | ICD-10-CM | POA: Diagnosis not present

## 2017-09-22 DIAGNOSIS — Z882 Allergy status to sulfonamides status: Secondary | ICD-10-CM | POA: Diagnosis not present

## 2017-09-22 DIAGNOSIS — K219 Gastro-esophageal reflux disease without esophagitis: Secondary | ICD-10-CM | POA: Diagnosis not present

## 2017-09-22 DIAGNOSIS — Z955 Presence of coronary angioplasty implant and graft: Secondary | ICD-10-CM | POA: Diagnosis not present

## 2017-09-22 DIAGNOSIS — N183 Chronic kidney disease, stage 3 (moderate): Secondary | ICD-10-CM | POA: Diagnosis not present

## 2017-09-22 DIAGNOSIS — I2510 Atherosclerotic heart disease of native coronary artery without angina pectoris: Secondary | ICD-10-CM | POA: Diagnosis not present

## 2017-09-22 DIAGNOSIS — I483 Typical atrial flutter: Secondary | ICD-10-CM | POA: Diagnosis not present

## 2017-09-22 DIAGNOSIS — I452 Bifascicular block: Secondary | ICD-10-CM | POA: Diagnosis not present

## 2017-09-22 DIAGNOSIS — Z7902 Long term (current) use of antithrombotics/antiplatelets: Secondary | ICD-10-CM | POA: Diagnosis not present

## 2017-09-22 DIAGNOSIS — I5022 Chronic systolic (congestive) heart failure: Secondary | ICD-10-CM | POA: Diagnosis not present

## 2017-09-22 DIAGNOSIS — Z7901 Long term (current) use of anticoagulants: Secondary | ICD-10-CM | POA: Diagnosis not present

## 2017-09-22 DIAGNOSIS — F17210 Nicotine dependence, cigarettes, uncomplicated: Secondary | ICD-10-CM | POA: Diagnosis not present

## 2017-09-22 LAB — BASIC METABOLIC PANEL
ANION GAP: 4 — AB (ref 5–15)
BUN: 11 mg/dL (ref 6–20)
CHLORIDE: 107 mmol/L (ref 101–111)
CO2: 28 mmol/L (ref 22–32)
Calcium: 8.5 mg/dL — ABNORMAL LOW (ref 8.9–10.3)
Creatinine, Ser: 1.39 mg/dL — ABNORMAL HIGH (ref 0.61–1.24)
GFR calc Af Amer: 56 mL/min — ABNORMAL LOW (ref >60)
GFR calc non Af Amer: 48 mL/min — ABNORMAL LOW (ref >60)
GLUCOSE: 104 mg/dL — AB (ref 65–99)
POTASSIUM: 3.8 mmol/L (ref 3.5–5.1)
Sodium: 139 mmol/L (ref 135–145)

## 2017-09-22 LAB — CBC
HEMATOCRIT: 40.4 % (ref 39.0–52.0)
Hemoglobin: 13 g/dL (ref 13.0–17.0)
MCH: 30.3 pg (ref 26.0–34.0)
MCHC: 32.2 g/dL (ref 30.0–36.0)
MCV: 94.2 fL (ref 78.0–100.0)
Platelets: 145 10*3/uL — ABNORMAL LOW (ref 150–400)
RBC: 4.29 MIL/uL (ref 4.22–5.81)
RDW: 14.1 % (ref 11.5–15.5)
WBC: 9.7 10*3/uL (ref 4.0–10.5)

## 2017-09-22 LAB — LIPID PANEL
CHOLESTEROL: 108 mg/dL (ref 0–200)
HDL: 38 mg/dL — ABNORMAL LOW (ref >40)
LDL Cholesterol: 57 mg/dL (ref 0–99)
TRIGLYCERIDES: 63 mg/dL (ref <150)
Total CHOL/HDL Ratio: 2.8 RATIO
VLDL: 13 mg/dL (ref 0–40)

## 2017-09-22 MED ORDER — NICOTINE 14 MG/24HR TD PT24: 14.0000 mg | MEDICATED_PATCH | Freq: Every day | TRANSDERMAL | 0 refills | Status: DC

## 2017-09-22 MED ORDER — MECLIZINE HCL 12.5 MG PO TABS: 12.5000 mg | ORAL_TABLET | Freq: Three times a day (TID) | ORAL | 0 refills | Status: DC

## 2017-09-22 NOTE — Discharge Summary (Signed)
Physician Discharge Summary  Harold English:[id number] DOB: [birth year] DOA: 09/21/2017  PCP: Estanislado Pandy, MD  Admit date: 09/21/2017 Discharge date: 09/22/2017  Time spent: 35 minutes  Recommendations for Outpatient Follow-up:  1. Reassess complete resolution of his vertiginous symptoms. 2. Check orthostatic vital signs and reassess blood pressure, with intention to further adjust antihypertensive regimen as needed. 3. Cardiology to reassess stability to restart Aldactone.   Discharge Diagnoses:  Principal Problem:   Vertigo Active Problems:   Mixed hyperlipidemia   TOBACCO ABUSE   Essential hypertension, benign   Ischemic cardiomyopathy   Atypical chest pain   Typical atrial flutter (HCC)   Discharge Condition: Stable and improved.  Patient instructed to follow-up with PCP in 1 week and with cardiology service as previously instructed.  Diet recommendation: Heart healthy diet.  Filed Weights   09/21/17 0552 09/21/17 0900 09/22/17 0530  Weight: 99.8 kg (220 lb) 99.8 kg (220 lb) 104.3 kg (229 lb 15 oz)    History of present illness:  77 y/o male with PMH significant for CAD ( s/p post recent PCI), HTN, HLD, tobacco abuse, GERD, renal insufficiency and ischemic cardiomyopathy; who presented to ED complaining of chest pain and dizziness. Patient reported feeling dizzy, room spinning sensation, with associated nausea and vomiting. Also reported experiencing chest pain; located anteriorly, non-radiated, 5-6/10 in intensity, lasting approx 30-40 minutes, associated with the vomiting episode, reported symptoms are nothing similar to what he experienced with recent ACS. No fever, no chills, no hematemesis, no hematuria, no melena, no hematochezia, no dysuria, no focal motor deficits or any other complaints.   Hospital Course:  1-vertigo: She denies hearing impairment and is not having any focal motor deficits.  -CT scan of the head negative for acute intracranial  abnormalities -Unlikely ischemic in nature given chronic use of Plavix and Eliquis. -Improvement in symptoms with the use of meclizine. -Most likely associated with vestibular dysfunction. -Physical therapy for vestibular training requested and at discharge plans are to pursuit HHPT with subsequent outpatient PT. -will continue low dose meclizine TID -advise to keep himself well hydrated, to take time while changing positions and to stop aldactone until follow up with PCP/cardiology service.  -TSH, phosphorus magnesium within normal limits.  2-Mixed hyperlipidemia -continue statins   3-chest pain: -At discharge patient denies any chest pain. -He discomfort most likely residual discomfort from recent cardioversion.  -no ischemic changes on EKG or telemetry -per cardiology continue current therapy and no need to repeat echo or pursuit further ischemic work up. -troponin mildly elevated, but in trending down fashion in comparison to recent admisison.  4-TOBACCO ABUSE -Extensive discussion regarding smoking cessation on 5/30 first/19. -Patient contemplating quitting. -Continue nicotine patch at discharge.  5-Essential hypertension, benign -Stable  -Continue current antihypertensive regimen; with exception of a spironolactone (medication that has been held at discharge due to orthostatic changes).  6-ischemic cardiomyopathy -With recent DES placement -continue b-blocker and continue plavix -follow cardiology as an outpatient as already scheduled. -Patient encouraged to continue heart healthy diet with and to pay attention to his sodium intake.  7-Typical atrial flutter (HCC) -CHADsVASC score 4 -continue eliquis -continue pacerone and b-blocker for rate control  8-HLD -continue statins  Procedures:  See below for x-ray reports.  Consultations:  Cardiology  Discharge Exam: Vitals:   09/22/17 0530 09/22/17 1428  BP:  110/77  Pulse:  99  Resp: 16 16  Temp: 98.1  F (36.7 C) 98.7 F (37.1 C)  SpO2: 97% 99%    General:  Afebrile, no chest pain, no shortness of breath, no nausea, no vomiting.  Still feeling slightly dizzy when changing positions too quick but significantly improved.  Reports that he will like to go home and has declined referral to skilled nursing facility. Cardiovascular: Rate control, no rubs, no gallops, no murmurs, no JVD. Respiratory: Clear to auscultation bilaterally, no using accessory muscles Abdomen: Soft, nontender, nondistended, positive bowel sounds Extremities: No edema, no cyanosis or clubbing.  Left great toe with mild discomfort on palpation due to ingrown nail (there is no erythema, drainage or signs of superimposed infection).  Discharge Instructions   Discharge Instructions    (HEART FAILURE PATIENTS) Call MD:  Anytime you have any of the following symptoms: 1) 3 pound weight gain in 24 hours or 5 pounds in 1 week 2) shortness of breath, with or without a dry hacking cough 3) swelling in the hands, feet or stomach 4) if you have to sleep on extra pillows at night in order to breathe.   Complete by:  As directed    Discharge instructions   Complete by:  As directed    Keep yourself well-hydrated Check your weight on daily basis Follow heart healthy diet (less than 2 g of sodium in 24 hours) Follow-up with PCP and cardiology as already as scheduled Take your time when changing positions and make sure to keep your balance daily before initiating ambulation. Follow-up of recommendations by home health physical therapy staff to further improve your conditioning.   Increase activity slowly   Complete by:  As directed      Allergies as of 09/22/2017      Reactions   Sulfonamide Derivatives       Medication List    STOP taking these medications   spironolactone 25 MG tablet Commonly known as:  ALDACTONE     TAKE these medications   amiodarone 200 MG tablet Commonly known as:  PACERONE Take 1 tablet (200 mg  total) by mouth 2 (two) times daily for 5 days.   apixaban 5 MG Tabs tablet Commonly known as:  ELIQUIS Take 1 tablet (5 mg total) by mouth 2 (two) times daily.   atorvastatin 40 MG tablet Commonly known as:  LIPITOR Take 40 mg by mouth at bedtime.   carvedilol 12.5 MG tablet Commonly known as:  COREG Take 12.5 mg by mouth at bedtime.   clopidogrel 75 MG tablet Commonly known as:  PLAVIX Take 1 tablet (75 mg total) by mouth daily with breakfast.   meclizine 12.5 MG tablet Commonly known as:  ANTIVERT Take 1 tablet (12.5 mg total) by mouth 3 (three) times daily.   nicotine 14 mg/24hr patch Commonly known as:  NICODERM CQ - dosed in mg/24 hours Place 1 patch (14 mg total) onto the skin daily. Start taking on:  09/23/2017      Allergies  Allergen Reactions  . Sulfonamide Derivatives    Follow-up Information    Sasser, Clarene Critchley, MD. Schedule an appointment as soon as possible for a visit in 1 week(s).   Specialty:  Family Medicine Contact information: [address] [place] [address] [phone]        Lyn Records, MD .   Specialty:  Cardiology Contact information: [phone] [address] [place] [address] [phone]            The results of significant diagnostics from this hospitalization (including imaging, microbiology, ancillary and laboratory) are listed below for reference.    Significant  Diagnostic Studies: Ct Head Wo Contrast  Result Date: 09/21/2017 CLINICAL DATA:  Persistent central vertigo EXAM: CT HEAD WITHOUT CONTRAST TECHNIQUE: Contiguous axial images were obtained from the base of the skull through the vertex without intravenous contrast. COMPARISON:  None. FINDINGS: Brain: No evidence of acute infarction, hemorrhage, hydrocephalus, extra-axial collection or mass lesion/mass effect. No finding in the brainstem, cisterns, or cerebellum to explain the history. Low density below the right putamen is likely dilated perivascular space.  Vascular: Atherosclerotic calcification. Skull: No acute or aggressive finding. Sinuses/Orbits: Negative.  Clear mastoid and middle ears. IMPRESSION: Negative head CT. Electronically Signed   By: Marnee SpringJonathon  Watts M.D.   On: 09/21/2017 09:57   [place] Renal  Result Date: 09/17/2017 CLINICAL DATA:  Chronic renal disease EXAM: RENAL / URINARY TRACT ULTRASOUND COMPLETE COMPARISON:  None. FINDINGS: Right Kidney: Length: 10.3 cm. Echogenicity and renal cortical thickness are within normal limits. No perinephric fluid or hydronephrosis visualized. There is a cyst arising eccentrically from the lower pole left kidney measuring 4.8 x 4.4 x 3.7 cm. No sonographically demonstrable calculus or ureterectasis. Left Kidney: Length: 11.5 cm. Echogenicity and renal cortical thickness are within normal limits. No perinephric fluid or hydronephrosis visualized. There is a cyst arising from upper to mid left kidney measuring 1.4 x 1.3 x 1.3 cm. No sonographically demonstrable calculus or ureterectasis. Bladder: Appears normal for degree of bladder distention. IMPRESSION: Cyst in each kidney, considerably larger on the right than on the left. Study otherwise unremarkable. Electronically Signed   By: Bretta BangWilliam  Woodruff III M.D.   On: 09/17/2017 08:39   Dg Chest Portable 1 View  Result Date: 09/21/2017 CLINICAL DATA:  Acute onset of generalized chest pain, abdominal pain and nausea. EXAM: PORTABLE CHEST 1 VIEW COMPARISON:  Chest radiograph performed 09/16/2017 FINDINGS: The lungs are well-aerated. Mild right basilar airspace opacity could reflect atelectasis or mild pneumonia. Mild vascular congestion is noted. There is no evidence of pleural effusion or pneumothorax. The cardiomediastinal silhouette is normal in size. The patient is status post median sternotomy. No acute osseous abnormalities are seen. IMPRESSION: Mild right basilar airspace opacity could reflect atelectasis or mild pneumonia. Mild vascular congestion noted.  Electronically Signed   By: Roanna RaiderJeffery  Chang M.D.   On: 09/21/2017 06:25   Dg Chest Portable 1 View  Result Date: 09/16/2017 CLINICAL DATA:  Tachycardia and chest pain EXAM: PORTABLE CHEST 1 VIEW COMPARISON:  Sep 06, 2013 FINDINGS: There is no edema or consolidation. Heart is upper normal in size with pulmonary vascularity normal. Patient is status post coronary artery bypass grafting. No pneumothorax. No adenopathy. No bone lesions. IMPRESSION: No edema or consolidation.  Heart upper normal in size. Electronically Signed   By: Bretta BangWilliam  Woodruff III M.D.   On: 09/16/2017 17:10    Microbiology: Recent Results (from the past 240 hour(s))  MRSA PCR Screening     Status: None   Collection Time: 09/17/17 11:48 AM  Result Value Ref Range Status   MRSA by PCR NEGATIVE NEGATIVE Final    Comment:        The GeneXpert MRSA Assay (FDA approved for NASAL specimens only), is one component of a comprehensive MRSA colonization surveillance program. It is not intended to diagnose MRSA infection nor to guide or monitor treatment for MRSA infections. Performed at [facility], [address] [place], [place] [address]      Labs: Basic Metabolic Panel: Recent Labs  Lab 09/16/17 1647 09/16/17 2125 09/17/17 [id number] 09/18/17 0444 09/21/17 0557 09/21/17 1402  09/22/17 0637  NA 137  --  139 138 138  --  139  K 4.9  --  4.4 4.1 4.2  --  3.8  CL 104  --  107 107 109  --  107  CO2 21*  --  25 22 21*  --  28  GLUCOSE 212*  --  96 116* 187*  --  104*  BUN 23*  --  22* 15 12  --  11  CREATININE 1.78* 1.64* 1.54* 1.51* 1.20  --  1.39*  CALCIUM 9.2  --  8.4* 8.2* 9.0  --  8.5*  MG 2.1  --   --   --   --  2.0  --   PHOS 4.4  --   --   --   --  3.4  --    Liver Function Tests: Recent Labs  Lab 09/16/17 1647 09/17/17 0521 09/21/17 0557  AST 91* 49* 18  ALT 92* 70* 32  ALKPHOS 50 45 45  BILITOT 1.9* 1.1 1.2  PROT 6.8 5.6* 6.4*  ALBUMIN 3.8 3.2* 3.3*   Recent Labs  Lab 09/21/17 0557   LIPASE 32   CBC: Recent Labs  Lab 09/16/17 1647 09/16/17 2125 09/17/17 0521 09/18/17 0444 09/21/17 0557 09/22/17 0637  WBC 12.0* 10.6* 10.4 11.0* 11.1* 9.7  NEUTROABS 8.3*  --   --   --  7.3  --   HGB 15.9 14.0 13.3 13.2 14.7 13.0  HCT 47.5 43.5 41.0 41.4 44.2 40.4  MCV 93.9 94.2 94.3 95.0 93.4 94.2  PLT 185 150 140* 124* 178 145*   Cardiac Enzymes: Recent Labs  Lab 09/16/17 1647 09/16/17 2125 09/17/17 0246 09/17/17 0833 09/21/17 0557  TROPONINI 0.03* 0.67* 1.30* 1.17* 0.15*   CBG: Recent Labs  Lab 09/17/17 0526 09/17/17 0748 09/17/17 1250 09/17/17 1559 09/17/17 2044  GLUCAP 97 89 102* 120* 125*    Signed:  Vassie Loll MD.  [facility] 09/22/2017, 4:09 PM

## 2017-09-22 NOTE — Progress Notes (Signed)
Patient state understanding of discharge instructions,

## 2017-09-22 NOTE — Progress Notes (Signed)
Physical Therapy Treatment Patient Details Name: Harold CrandallJames B Ausley MRN: [id number] DOB: December 03, 1940 Today's Date: 09/22/2017    History of Present Illness This is a 77 year old male with a history of coronary artery disease, ischemic cardiomyopathy, recent history of ventricular tachyarrhythmia requiring admission and recent stent placement on 09/18/2017 who presents with nausea and dizziness.  Patient reports onset of room spinning dizziness, nausea, and vomiting prior to arrival.  He also had some anterior chest pain that was nonradiating.  He is currently chest pain-free.  He does report some improvement of his dizziness but states "it is better when my eyes are shut."  He states the dizziness was worse with position changes.  He denies any abdominal pain.  Denies any fevers or shortness of breath.  He reports taking his medications as prescribed.  No history of vertigo or similar dizziness in the past.  Denies any focal weakness, numbness, strokelike symptoms.    PT Comments    Patient presents with much improved activity tolerance compared to yesterday. He experienced 1 episode of dizziness with sitting up at side of bed that resolved in ~ 10 seconds. He was independent with bed mobility and required supervision/min guard for sit to stand transfers with RW. He remains unsteady with standing and marching in place. He attempted gait without RW and required minimum assist to maintain balance. gait with RW  He requires supervision/min guarding and he had 1 episode of LOB during turn requiring up to min Assist to prevent a fall. I discussed the benefits of ongoing rehab to address his balance deficits at a skilled inpatient facility however he is adamant about returning hoe after staying with his sister for 2 days. I have educated him on the need for more therapy toa ddress his balance impairments and improve his safety for himself and to return to PLOF in caring for his wife. He is agreeable to have HHPT for  his balance and could also benefit from outpatient PT to further address balance deficits. Patient will benefit from continued physical therapy in hospital and recommended venue below to increase strength, balance, endurance for safe ADLs and gait.    Follow Up Recommendations  SNF;Home health PT(patient would benefit from SNF for rehab but has verbalized he will not go to a SNF. He is agreeable to HHPT to address balance deficits and eventually Outpatient PT to address balance to reduce risk of falling.)     Equipment Recommendations  None recommended by PT    Recommendations for Other Services       Precautions / Restrictions Precautions Precautions: Fall Restrictions Weight Bearing Restrictions: No    Mobility  Bed Mobility Overal bed mobility: Needs Assistance       Supine to sit: Independent Sit to supine: Independent   General bed mobility comments: reports dizziness upon sitting that resolves after ~ 10 seconds  Transfers Overall transfer level: Needs assistance Equipment used: Rolling walker (2 wheeled) Transfers: Sit to/from Stand Sit to Stand: Supervision         General transfer comment: patient unsteady and impulsive with transfer  Ambulation/Gait Ambulation/Gait assistance: Supervision;Min guard Ambulation Distance (Feet): 75 Feet Assistive device: Rolling walker (2 wheeled) Gait Pattern/deviations: Step-to pattern;Decreased stride length;Decreased dorsiflexion - right;Trunk flexed;Narrow base of support   Gait velocity interpretation: <1.8 ft/sec, indicate of risk for recurrent falls General Gait Details: patient with Rt LE externally rotated and abducted, he reports he has walked this way for years, patient extremely unsteady without walker and unsteady during turns  requiring minimum gaurding and assist for 1 episode of LOB during turn, requires verbal cues to manage RW safely and maintain safe walking proximity to walker       Balance Overall  balance assessment: Needs assistance Sitting-balance support: Feet supported;No upper extremity supported Sitting balance-Leahy Scale: Good     Standing balance support: Bilateral upper extremity supported;During functional activity Standing balance-Leahy Scale: Fair Standing balance comment: patient able to march in place with Bil UE support, 1 episode of LOB during standing marching with high marching, and 1 episode of LOB during gait in turn      Cognition Arousal/Alertness: Awake/alert Behavior During Therapy: [facility] for tasks assessed/performed Overall Cognitive Status: Within Functional Limits for tasks assessed              Pertinent Vitals/Pain Pain Assessment: No/denies pain           PT Goals (current goals can now be found in the care plan section) Acute Rehab PT Goals Patient Stated Goal: return home capable of taking care of spouse PT Goal Formulation: With patient/family Time For Goal Achievement: 10/05/17 Potential to Achieve Goals: Fair Progress towards PT goals: Progressing toward goals    Frequency    Min 3X/week      PT Plan Current plan remains appropriate       AM-PAC PT "6 Clicks" Daily Activity  Outcome Measure  Difficulty turning over in bed (including adjusting bedclothes, sheets and blankets)?: None Difficulty moving from lying on back to sitting on the side of the bed? : None Difficulty sitting down on and standing up from a chair with arms (e.g., wheelchair, bedside commode, etc,.)?: A Little Help needed moving to and from a bed to chair (including a wheelchair)?: A Little   Help needed climbing 3-5 steps with a railing? : A Little 6 Click Score: 17    End of Session   Activity Tolerance: Patient tolerated treatment well Patient left: in bed;with call bell/phone within reach;with bed alarm set Nurse Communication: Mobility status PT Visit Diagnosis: Unsteadiness on feet (R26.81);Other abnormalities of gait and mobility  (R26.89);Muscle weakness (generalized) (M62.81)     Time: 1212-1224 PT Time Calculation (min) (ACUTE ONLY): 12 min  Charges:  $Therapeutic Activity: 8-22 mins                    G Codes:       Valentino Saxon, PT, DPT Physical Therapist with [facility]  09/22/2017 12:48 PM

## 2017-09-26 ENCOUNTER — Encounter: Payer: Self-pay | Admitting: Physician Assistant

## 2017-09-26 ENCOUNTER — Ambulatory Visit: Payer: Medicare Other | Admitting: Physician Assistant

## 2017-09-26 VITALS — BP 84/62 | HR 113 | Ht 74.0 in | Wt 210.0 lb

## 2017-09-26 DIAGNOSIS — I25810 Atherosclerosis of coronary artery bypass graft(s) without angina pectoris: Secondary | ICD-10-CM | POA: Diagnosis not present

## 2017-09-26 DIAGNOSIS — I10 Essential (primary) hypertension: Secondary | ICD-10-CM | POA: Diagnosis not present

## 2017-09-26 DIAGNOSIS — E785 Hyperlipidemia, unspecified: Secondary | ICD-10-CM

## 2017-09-26 DIAGNOSIS — I255 Ischemic cardiomyopathy: Secondary | ICD-10-CM

## 2017-09-26 DIAGNOSIS — I480 Paroxysmal atrial fibrillation: Secondary | ICD-10-CM

## 2017-09-26 LAB — CBC
HEMATOCRIT: 46.1 % (ref 37.5–51.0)
HEMOGLOBIN: 16.1 g/dL (ref 13.0–17.7)
MCH: 31.4 pg (ref 26.6–33.0)
MCHC: 34.9 g/dL (ref 31.5–35.7)
MCV: 90 fL (ref 79–97)
Platelets: 247 10*3/uL (ref 150–450)
RBC: 5.12 x10E6/uL (ref 4.14–5.80)
RDW: 13.6 % (ref 12.3–15.4)
WBC: 12 10*3/uL — ABNORMAL HIGH (ref 3.4–10.8)

## 2017-09-26 MED ORDER — DIGOXIN 125 MCG PO TABS: 0.0625 mg | ORAL_TABLET | Freq: Every day | ORAL | 3 refills | Status: DC

## 2017-09-26 MED ORDER — AMIODARONE HCL 200 MG PO TABS: 200.0000 mg | ORAL_TABLET | Freq: Every day | ORAL | 3 refills | Status: DC

## 2017-09-26 NOTE — Patient Instructions (Addendum)
Medication Instructions:  DISCONTINUE Spironolactone  START Digoxin take half tab of 0.125mg   once a day DECREASE Pacerone to Take 1 tablet once a day   Labwork: Your physician [facility] that [facility]  Testing/Procedures: None   Follow-Up: Your physician recommends that you schedule a follow-up appointment in: 3-4 wks with Azalee CourseHao Meng, PA and Keep upcoming appointment with Dr Katrinka BlazingSmith as scheduled  Any Other Special Instructions Will Be Listed Below (If Applicable).  If you need a refill on your cardiac medications before your next appointment, please call your pharmacy.

## 2017-09-26 NOTE — Progress Notes (Signed)
Cardiology Office Note    Date:  09/27/2017   ID:  Harold English, DOB [birth year], MRN [id number]  PCP:  Estanislado PandySasser, Harold W, MD  Cardiologist:  Harold English  Chief Complaint  Patient presents with  . Follow-up    seen for Harold English.     History of Present Illness:  Harold English is a 77 y.o. male with PMH of CAD s/p CABG, ICM, HTN, HLD and PAF.  [facility] had [facility].  Echocardiogram in July 2011 showed EF of 25 to 30% with mild MR.  Myoview in September 2011 showed EF 23%, prior anterior, septal, apical, inferior infarct with no ischemia.  Patient was lost to follow-up since 2011 until this year.  He was admitted to the hospital on 09/17/2017 with chest pain while moving items in the heat.  The symptoms did not resolve and he was eventually taken to any pain hospital.  He was noted to be in atrial fibrillation with RVR at the time.  He was also placed on amiodarone and ultimately converted.  He was placed on Eliquis.  Troponin was elevated at the time and she was transferred [facility].  Troponin eventually peaked at 1.3.  Echocardiogram obtained on 09/18/2017 showed EF 20 to 25%, diffuse hypokinesis with apical akinesis, mild MR, PA peak pressure 49 mmHg.  Cardiac catheterization performed on the same day showed severe three-vessel CAD with total occlusion of LAD, total occlusion of RCA and a total occlusion of OM, continued patency of sequential SVG to intermediate and OM branch collateralizing LAD and RCA distribution, severe stenosis in proximal left circumflex lesion treated with 3.0 x 15 mm resolute Onyx DES.  It was recommended for the patient to continue Eliquis and antiplatelet therapy of Plavix for at least 6 months.  He was eventually discharged on amiodarone 200 mg twice daily with instruction to switch to 200 mg daily of amiodarone after 5 days.  If his ejection fraction does not improve to greater than 35%,  will need to consider ICD after 3 months.  He was not placed on higher dose of Crestor due to mild elevation of liver function and addition of amiodarone.  She was discharged on the 29th and return to the hospital on the 31st and was admitted again for chest pain and elevated troponin.  EKG showed atrial flutter with a heart rate of 120.  Patient presents today today for cardiology office visit.  He is on 12.5 mg twice daily of carvedilol and also amiodarone.  He has completed 5-day English of 200 mg twice a day of amiodarone, and is ready to transition to 200 mg daily thereafter.  He has been compliant with Plavix and Eliquis without significant sign of bleeding.  He did mention to his primary care provider did obtain some blood work on Monday however does not know what type of blood work.  He denies any significant chest pain.  Right now he has no cardiac awareness of atrial fibrillation or atrial flutter despite that he continued to be in atrial fibrillation at this time.  Heart rate is borderline controlled with ventricular rate of 113.  I will add low-dose digoxin given his heart failure and also atrial fibrillation.  Unfortunately, I am unable to uptitrate his heart failure medication given blood pressure in the 80s.  He says his blood pressure usually runs low.   Past Medical History:  Diagnosis Date  .  CAD (coronary artery disease)   . GERD (gastroesophageal reflux disease)   . Hyperlipidemia   . Hypertension   . Ischemic cardiomyopathy   . MI (myocardial infarction) (HCC)   . Renal insufficiency     Past Surgical History:  Procedure Laterality Date  . ADENOIDECTOMY    . BACK SURGERY    . CARDIAC SURGERY    . CORONARY ARTERY BYPASS GRAFT    . CORONARY STENT INTERVENTION N/A 09/18/2017   Procedure: CORONARY STENT INTERVENTION;  Surgeon: Tonny Bollman, MD;  Location: [facility] INVASIVE CV LAB;  Service: Cardiovascular;  Laterality: N/A;  . LEFT HEART CATH AND CORS/GRAFTS ANGIOGRAPHY N/A  09/18/2017   Procedure: LEFT HEART CATH AND CORS/GRAFTS ANGIOGRAPHY;  Surgeon: Tonny Bollman, MD;  Location: [facility] INVASIVE CV LAB;  Service: Cardiovascular;  Laterality: N/A;  . TONSILLECTOMY      Current Medications: Outpatient Medications Prior to Visit  Medication Sig Dispense Refill  . apixaban (ELIQUIS) 5 MG TABS tablet Take 1 tablet (5 mg total) by mouth 2 (two) times daily. 60 tablet 0  . atorvastatin (LIPITOR) 40 MG tablet Take 40 mg by mouth at bedtime.     . carvedilol (COREG) 12.5 MG tablet Take 12.5 mg by mouth at bedtime.     . clopidogrel (PLAVIX) 75 MG tablet Take 1 tablet (75 mg total) by mouth daily with breakfast. 30 tablet 0  . meclizine (ANTIVERT) 12.5 MG tablet Take 1 tablet (12.5 mg total) by mouth 3 (three) times daily. 90 tablet 0  . nicotine (NICODERM CQ - DOSED IN MG/24 HOURS) 14 mg/24hr patch Place 1 patch (14 mg total) onto the skin daily. 28 patch 0  . spironolactone (ALDACTONE) 25 MG tablet Take 25 mg by mouth daily.    Marland Kitchen amiodarone (PACERONE) 200 MG tablet Take 1 tablet (200 mg total) by mouth 2 (two) times daily for 5 days. 10 tablet 0   No facility-administered medications prior to visit.      Allergies:   Sulfonamide derivatives   Social History   Socioeconomic History  . Marital status: Married    Spouse name: Not on file  . Number of children: Not on file  . Years of education: Not on file  . Highest education level: Not on file  Occupational History    Comment: Retired  Engineer, production  . Financial resource strain: Not on file  . Food insecurity:    Worry: Not on file    Inability: Not on file  . Transportation needs:    Medical: Not on file    Non-medical: Not on file  Tobacco Use  . Smoking status: Former Smoker    Packs/day: 0.75    Years: 0.00    Pack years: 0.00    Types: Cigarettes    Last attempt to quit: 09/19/2017    Years since quitting: 0.0  . Smokeless tobacco: Never Used  Substance and Sexual Activity  . Alcohol use: Yes      Comment: 1 drink every 3 days   . Drug use: No  . Sexual activity: Not on file  Lifestyle  . Physical activity:    Days per week: Not on file    Minutes per session: Not on file  . Stress: Not on file  Relationships  . Social connections:    Talks on phone: Not on file    Gets together: Not on file    Attends religious service: Not on file    Active member of club or organization: Not  on file    Attends meetings of clubs or organizations: Not on file    Relationship status: Not on file  Other Topics Concern  . Not on file  Social History Narrative  . Not on file     Family History:  The patient's family history includes CAD in his father.   ROS:   Please see the history of present illness.    ROS All other systems reviewed and are negative.   PHYSICAL EXAM:   VS:  BP (!) 84/62   Pulse (!) 113   Ht 6\' 2"  (1.88 m)   Wt 210 lb (95.3 kg)   BMI 26.96 kg/m    GEN: Well nourished, well developed, in no acute distress  HEENT: normal  Neck: no JVD, carotid bruits, or masses Cardiac: irregularly irregular; no murmurs, rubs, or gallops,no edema  Respiratory:  clear to auscultation bilaterally, normal work of breathing GI: soft, nontender, nondistended, + BS MS: no deformity or atrophy  Skin: warm and dry, no rash Neuro:  Alert and Oriented x 3, Strength and sensation are intact Psych: euthymic mood, full affect  Wt Readings from Last 3 Encounters:  09/26/17 210 lb (95.3 kg)  09/22/17 229 lb 15 oz (104.3 kg)  09/19/17 219 lb 12.8 oz (99.7 kg)      Studies/Labs Reviewed:   EKG:  EKG is ordered today.  The ekg ordered today demonstrates atrial fibrillation with RVR  Recent Labs: 09/21/2017: ALT 32; Magnesium 2.0; TSH 3.315 09/22/2017: BUN 11; Creatinine, Ser 1.39; Potassium 3.8; Sodium 139 09/26/2017: Hemoglobin 16.1; Platelets 247   Lipid Panel    Component Value Date/Time   CHOL 108 09/22/2017 0637   TRIG 63 09/22/2017 0637   HDL 38 (L) 09/22/2017 0637    CHOLHDL 2.8 09/22/2017 0637   VLDL 13 09/22/2017 0637   LDLCALC 57 09/22/2017 0637    Additional studies/ records that were reviewed today include:   Echo 09/18/2017 LV EF: 20% -   25% Study Conclusions  - Left ventricle: The cavity size was normal. There was mild   concentric hypertrophy. Systolic function was severely reduced.   The estimated ejection fraction was in the range of 20% to 25%.   Diffuse hypokinesis with apical akinesis. Doppler parameters are   consistent with high ventricular filling pressure. - Aortic valve: Transvalvular velocity was within the normal range.   There was no stenosis. There was no regurgitation. - Mitral valve: Transvalvular velocity was within the normal range.   There was no evidence for stenosis. There was mild regurgitation. - Left atrium: The atrium was moderately dilated. - Right ventricle: The cavity size was normal. Wall thickness was   normal. Systolic function was normal. - Right atrium: The atrium was severely dilated. - Tricuspid valve: There was mild regurgitation. - Pulmonary arteries: Systolic pressure was mildly to moderately   increased. PA peak pressure: 49 mm Hg (S).   Cath 09/18/2017 Conclusion   1. Severe native 3 vessel CAD with total occlusion of the LAD, total occlusion of the RCA, and total occlusion of the OM branches of the circumflex.  2. S/P CABG with continued patency of the sequential vein graft to the intermediate and OM branches, collateralizing the LAD and RCA distributions 3. Severe stenosis of the proximal LCx, treated successfully with DES implantation (3.0x15 mm Resolute Onyx) 4. Elevated LVEDP with known severe LV systolic dysfunction  Recommend: Recommend to resume Apixaban, at currently prescribed dose and frequency, today, 09/18/2017. Recommend antiplatelet therapy of Clopidogrel  75mg  QD for 6 months.     ASSESSMENT:    1. Coronary artery disease involving coronary bypass graft of native heart  without angina pectoris   2. Ischemic cardiomyopathy   3. Essential hypertension   4. Hyperlipidemia, unspecified hyperlipidemia type   5. PAF (paroxysmal atrial fibrillation) (HCC)      PLAN:  In order of problems listed above:  1. CAD s/p CABG: On Plavix, not on aspirin given the need for Eliquis.  Denies any recurrent chest pain.  2. Ischemic cardiomyopathy: On carvedilol, blood pressure too low.  Spironolactone was discontinued during the recent hospitalization.  May consider restarting spironolactone at a later time if blood pressure,.  I will add the digoxin for both atrial fibrillation and heart failure.  3. PAF: Heart rate barely controlled in atrial fibrillation, add low-dose digoxin.  Continue amiodarone, decrease amiodarone to 200 mg daily  4. Hypertension: Blood pressure low, continue carvedilol.   5. Hyperlipidemia: On Lipitor 40 mg daily.  Recent lipid panel obtained on 09/22/2017 showed total cholesterol, LDL and triglyceride well-controlled, HDL borderline low    Medication Adjustments/Labs and Tests Ordered: Current medicines are reviewed at length with the patient today.  Concerns regarding medicines are outlined above.  Medication changes, Labs and Tests ordered today are listed in the Patient Instructions below. Patient Instructions  Medication Instructions:  DISCONTINUE Spironolactone  START Digoxin take half tab of 0.125mg   once a day DECREASE Pacerone to Take 1 tablet once a day   Labwork: Your physician recommends that you return for lab work in: [facility]  Testing/Procedures: None   Follow-Up: Your physician recommends that you schedule a follow-up appointment in: 3-4 wks with Harold Course, PA and Keep upcoming appointment with Dr Katrinka Blazing as scheduled  Any Other Special Instructions Will Be Listed Below (If Applicable).  If you need a refill on your cardiac medications before your next appointment, please call your pharmacy.     Ramond Dial, [place]   09/27/2017 6:23 PM    [facility] [address] [place], [place], [place]  [address] Phone: [phone]; Fax: [phone]

## 2017-09-27 ENCOUNTER — Encounter: Payer: Self-pay | Admitting: Physician Assistant

## 2017-09-28 ENCOUNTER — Telehealth: Payer: Self-pay

## 2017-09-28 NOTE — Telephone Encounter (Signed)
Called patient to discuss meds per [facility] but was unable to reach patient. No answer and no voicemail set up. Need to give patient labs results as well.

## 2017-09-28 NOTE — Telephone Encounter (Signed)
-----   Message from [place]ao Meng, [place] sent at 09/28/2017  7:29 AM EDT ----- Regarding: check medication dosing Hi:   Tee, I saw Mr. Tones on Wesnesday, I noticed his medication list says carvedilol 12.5mg  at nighttime, can you double check with patient to see if he is taking carvediolol 12.5mg  daily or twice a day. If he is indeed taking it only once a day, then please instruct him to take 1/2 tablet (6.25mg ) in AM and another 1/2 tablet in PM instead to avoid dropping his blood pressure too low.   Ramond DialSigned, Hao Meng PA Pager: [phone]

## 2017-09-28 NOTE — Telephone Encounter (Signed)
Work number disconnected. Left message on patient's sisters voicemail advising her to inform patient to contact the office.

## 2017-10-04 MED ORDER — CARVEDILOL 6.25 MG PO TABS: 6.2500 mg | ORAL_TABLET | Freq: Two times a day (BID) | ORAL | 0 refills | Status: DC

## 2017-10-04 NOTE — Telephone Encounter (Signed)
Lab results: Red blood cell count normal, no sign of anemia

## 2017-10-04 NOTE — Telephone Encounter (Signed)
Spoke with patient and notified of labs and medication change; patient notified directly and voice understanding.

## 2017-10-04 NOTE — Telephone Encounter (Signed)
Attempted to call patient to discuss meds and lab results; no answer on home phone and left message on sisters number advising her to have patient contact office.

## 2017-10-24 ENCOUNTER — Telehealth: Payer: Self-pay | Admitting: Physician Assistant

## 2017-10-24 MED ORDER — APIXABAN 5 MG PO TABS: 5.0000 mg | ORAL_TABLET | Freq: Two times a day (BID) | ORAL | 6 refills | Status: DC

## 2017-10-24 MED ORDER — ATORVASTATIN CALCIUM 40 MG PO TABS: 40.0000 mg | ORAL_TABLET | Freq: Every day | ORAL | 2 refills | Status: AC

## 2017-10-24 MED ORDER — CLOPIDOGREL BISULFATE 75 MG PO TABS: 75.0000 mg | ORAL_TABLET | Freq: Every day | ORAL | 2 refills | Status: DC

## 2017-10-24 MED ORDER — CARVEDILOL 6.25 MG PO TABS: 6.2500 mg | ORAL_TABLET | Freq: Two times a day (BID) | ORAL | 2 refills | Status: AC

## 2017-10-24 NOTE — Telephone Encounter (Signed)
Follow Up:    Please call, pt said he had received a letter from you.

## 2017-10-24 NOTE — Telephone Encounter (Signed)
Spoke with patient and gave lab results from last month. Patient also requested refill on meds.

## 2017-10-31 ENCOUNTER — Ambulatory Visit (INDEPENDENT_AMBULATORY_CARE_PROVIDER_SITE_OTHER): Payer: Medicare Other | Admitting: Physician Assistant

## 2017-10-31 ENCOUNTER — Encounter: Payer: Self-pay | Admitting: Physician Assistant

## 2017-10-31 VITALS — BP 97/68 | HR 93 | Ht 74.0 in | Wt 211.2 lb

## 2017-10-31 DIAGNOSIS — E785 Hyperlipidemia, unspecified: Secondary | ICD-10-CM

## 2017-10-31 DIAGNOSIS — I483 Typical atrial flutter: Secondary | ICD-10-CM

## 2017-10-31 DIAGNOSIS — I25810 Atherosclerosis of coronary artery bypass graft(s) without angina pectoris: Secondary | ICD-10-CM

## 2017-10-31 DIAGNOSIS — I10 Essential (primary) hypertension: Secondary | ICD-10-CM | POA: Diagnosis not present

## 2017-10-31 DIAGNOSIS — I255 Ischemic cardiomyopathy: Secondary | ICD-10-CM | POA: Diagnosis not present

## 2017-10-31 MED ORDER — APIXABAN 5 MG PO TABS
5.0000 mg | ORAL_TABLET | Freq: Two times a day (BID) | ORAL | 3 refills | Status: DC
Start: 2017-10-31 — End: 2018-11-19

## 2017-10-31 MED ORDER — CLOPIDOGREL BISULFATE 75 MG PO TABS: 75.0000 mg | ORAL_TABLET | Freq: Every day | ORAL | 30 refills | Status: DC

## 2017-10-31 NOTE — Patient Instructions (Signed)
Medication Instructions:  Your physician recommends that you continue on your current medications as directed. Please refer to the Current Medication list given to you today.  Labwork: None   Testing/Procedures: None   Follow-Up: Keep upcoming appointment with Dr Katrinka BlazingSmith  Any Other Special Instructions Will Be Listed Below (If Applicable). If you need a refill on your cardiac medications before your next appointment, please call your pharmacy.

## 2017-10-31 NOTE — Progress Notes (Signed)
Cardiology Office Note    Date:  11/02/2017   ID:  BOGDAN VIVONA, DOB Jan 28, 1941, MRN [id number]  PCP:  Estanislado Pandy, MD  Cardiologist:  Dr. Katrinka Blazing   Chief Complaint  Patient presents with  . Follow-up    seen for Dr. Katrinka Blazing    History of Present Illness:  Harold English is a 77 y.o. male with PMH of CAD s/p CABG, ICM, HTN, HLD and PAF.  Patient had a previous bypass surgery in 2004 in [facility]  Echocardiogram in July 2011 showed EF of 25 to 30% with mild MR.  Myoview in September 2011 showed EF 23%, prior anterior, septal, apical, inferior infarct with no ischemia.  Patient was lost to follow-up since 2011 until this year.  He was admitted to the hospital on 09/17/2017 with chest pain while moving items in the heat.  The symptoms did not resolve and he was eventually taken to [facility].  He was noted to be in atrial fibrillation with RVR at the time.  He was also placed on amiodarone and ultimately converted.  He was placed on Eliquis.  Troponin was elevated at the time and he was transferred to [facility] for further evaluation.  Troponin eventually peaked at 1.3.  Echocardiogram obtained on 09/18/2017 showed EF 20 to 25%, diffuse hypokinesis with apical akinesis, mild MR, PA peak pressure 49 mmHg.  Cardiac catheterization performed on the same day showed severe three-vessel CAD with total occlusion of LAD, total occlusion of RCA and a total occlusion of OM, continued patency of sequential SVG to intermediate and OM branch collateralizing LAD and RCA distribution, severe stenosis in proximal left circumflex lesion treated with 3.0 x 15 mm resolute Onyx DES.  It was recommended for the patient to continue Eliquis and antiplatelet therapy of Plavix for at least 6 months.  He was eventually discharged on amiodarone 200 mg twice daily with instruction to switch to 200 mg daily of amiodarone after 5 days.  If his ejection fraction does not improve to greater than 35%,  will need to consider ICD after 3 months.  He was not placed on higher dose of Crestor due to mild elevation of liver function and addition of amiodarone.  He was discharged on the 29th and return to the hospital on the 31st and was admitted again for chest pain and elevated troponin. EKG showed atrial flutter with a heart rate of 120.  I last saw the patient on 09/26/2017, he was on 12.5 mg daily of carvedilol and amiodarone. His heart rate was uncontrolled at 113.  Instead of 12.5 mg daily of carvedilol, I switched him to 6.25 mg twice daily.  I added low-dose digoxin for his heart failure and atrial fibrillation.  He presents today for cardiology office visit.  He is heart rate is in the 80s and his symptoms to be very well controlled.  He did run out of Plavix and Eliquis about 2 days ago, I have quickly refilled all medication.  He remain in atrial flutter at this time.  He is scheduled to see Dr. Katrinka Blazing next month, if he remain in atrial flutter at that time, we can potentially consider outpatient DC cardioversion.  Afterward, I would recommend repeating echocardiogram in 2 to 77-month before deciding on the ICD placement.  Overall, he denies any chest pain or shortness of breath, he has no lower extremity edema, orthopnea or PND.    Past Medical History:  Diagnosis Date  .  CAD (coronary artery disease)   . GERD (gastroesophageal reflux disease)   . Hyperlipidemia   . Hypertension   . Ischemic cardiomyopathy   . MI (myocardial infarction) (HCC)   . Renal insufficiency     Past Surgical History:  Procedure Laterality Date  . ADENOIDECTOMY    . BACK SURGERY    . CARDIAC SURGERY    . CORONARY ARTERY BYPASS GRAFT    . CORONARY STENT INTERVENTION N/A 09/18/2017   Procedure: CORONARY STENT INTERVENTION;  Surgeon: Tonny Bollman, MD;  Location: [facility] INVASIVE CV LAB;  Service: Cardiovascular;  Laterality: N/A;  . LEFT HEART CATH AND CORS/GRAFTS ANGIOGRAPHY N/A 09/18/2017   Procedure: LEFT HEART  CATH AND CORS/GRAFTS ANGIOGRAPHY;  Surgeon: Tonny Bollman, MD;  Location: [facility] INVASIVE CV LAB;  Service: Cardiovascular;  Laterality: N/A;  . TONSILLECTOMY      Current Medications: Outpatient Medications Prior to Visit  Medication Sig Dispense Refill  . amiodarone (PACERONE) 200 MG tablet Take 1 tablet (200 mg total) by mouth daily. 30 tablet 3  . atorvastatin (LIPITOR) 40 MG tablet Take 1 tablet (40 mg total) by mouth at bedtime. 90 tablet 2  . carvedilol (COREG) 6.25 MG tablet Take 1 tablet (6.25 mg total) by mouth 2 (two) times daily with a meal. 180 tablet 2  . digoxin (LANOXIN) 0.125 MG tablet Take 0.5 tablets (0.0625 mg total) by mouth daily. 30 tablet 3  . meclizine (ANTIVERT) 12.5 MG tablet Take 1 tablet (12.5 mg total) by mouth 3 (three) times daily. 90 tablet 0  . nicotine (NICODERM CQ - DOSED IN MG/24 HOURS) 14 mg/24hr patch Place 1 patch (14 mg total) onto the skin daily. 28 patch 0  . apixaban (ELIQUIS) 5 MG TABS tablet Take 1 tablet (5 mg total) by mouth 2 (two) times daily. 180 tablet 6  . clopidogrel (PLAVIX) 75 MG tablet Take 1 tablet (75 mg total) by mouth daily with breakfast. 90 tablet 2   No facility-administered medications prior to visit.      Allergies:   Sulfonamide derivatives   Social History   Socioeconomic History  . Marital status: Married    Spouse name: Not on file  . Number of children: Not on file  . Years of education: Not on file  . Highest education level: Not on file  Occupational History    Comment: Retired  Engineer, production  . Financial resource strain: Not on file  . Food insecurity:    Worry: Not on file    Inability: Not on file  . Transportation needs:    Medical: Not on file    Non-medical: Not on file  Tobacco Use  . Smoking status: Former Smoker    Packs/day: 0.75    Years: 0.00    Pack years: 0.00    Types: Cigarettes    Last attempt to quit: 09/19/2017    Years since quitting: 0.1  . Smokeless tobacco: Never Used  Substance  and Sexual Activity  . Alcohol use: Yes    Comment: 1 drink every 3 days   . Drug use: No  . Sexual activity: Not on file  Lifestyle  . Physical activity:    Days per week: Not on file    Minutes per session: Not on file  . Stress: Not on file  Relationships  . Social connections:    Talks on phone: Not on file    Gets together: Not on file    Attends religious service: Not on file  Active member of club or organization: Not on file    Attends meetings of clubs or organizations: Not on file    Relationship status: Not on file  Other Topics Concern  . Not on file  Social History Narrative  . Not on file     Family History:  The patient's family history includes CAD in his father.   ROS:   Please see the history of present illness.    ROS All other systems reviewed and are negative.   PHYSICAL EXAM:   VS:  BP 97/68   Pulse 93   Ht 6\' 2"  (1.88 m)   Wt 211 lb 3.2 oz (95.8 kg)   BMI 27.12 kg/m    GEN: Well nourished, well developed, in no acute distress  HEENT: normal  Neck: no JVD, carotid bruits, or masses Cardiac: Irregular; no murmurs, rubs, or gallops,no edema  Respiratory:  clear to auscultation bilaterally, normal work of breathing GI: soft, nontender, nondistended, + BS MS: no deformity or atrophy  Skin: warm and dry, no rash Neuro:  Alert and Oriented x 3, Strength and sensation are intact Psych: euthymic mood, full affect  Wt Readings from Last 3 Encounters:  10/31/17 211 lb 3.2 oz (95.8 kg)  09/26/17 210 lb (95.3 kg)  09/22/17 229 lb 15 oz (104.3 kg)      Studies/Labs Reviewed:   EKG:  EKG is ordered today.  The ekg ordered today demonstrates atrial flutter  Recent Labs: 09/21/2017: ALT 32; Magnesium 2.0; TSH 3.315 09/22/2017: BUN 11; Creatinine, Ser 1.39; Potassium 3.8; Sodium 139 09/26/2017: Hemoglobin 16.1; Platelets 247   Lipid Panel    Component Value Date/Time   CHOL 108 09/22/2017 0637   TRIG 63 09/22/2017 0637   HDL 38 (L) 09/22/2017  0637   CHOLHDL 2.8 09/22/2017 0637   VLDL 13 09/22/2017 0637   LDLCALC 57 09/22/2017 0637    Additional studies/ records that were reviewed today include:   Echo 09/18/2017 LV EF: 20% - 25% Study Conclusions  - Left ventricle: The cavity size was normal. There was mild concentric hypertrophy. Systolic function was severely reduced. The estimated ejection fraction was in the range of 20% to 25%. Diffuse hypokinesis with apical akinesis. Doppler parameters are consistent with high ventricular filling pressure. - Aortic valve: Transvalvular velocity was within the normal range. There was no stenosis. There was no regurgitation. - Mitral valve: Transvalvular velocity was within the normal range. There was no evidence for stenosis. There was mild regurgitation. - Left atrium: The atrium was moderately dilated. - Right ventricle: The cavity size was normal. Wall thickness was normal. Systolic function was normal. - Right atrium: The atrium was severely dilated. - Tricuspid valve: There was mild regurgitation. - Pulmonary arteries: Systolic pressure was mildly to moderately increased. PA peak pressure: 49 mm Hg (S).   Cath 09/18/2017 Conclusion   1. Severe native 3 vessel CAD with total occlusion of the LAD, total occlusion of the RCA, and total occlusion of the OM branches of the circumflex.  2. S/P CABG with continued patency of the sequential vein graft to the intermediate and OM branches, collateralizing the LAD and RCA distributions 3. Severe stenosis of the proximal LCx, treated successfully with DES implantation (3.0x15 mm Resolute Onyx) 4. Elevated LVEDP with known severe LV systolic dysfunction  Recommend: Recommend to resume Apixaban, at currently prescribed dose and frequency, today, 09/18/2017. Recommend antiplatelet therapy of Clopidogrel 75mg  QD for 6 months.     ASSESSMENT:    1.  Typical atrial flutter (HCC)   2. Coronary artery disease  involving coronary bypass graft of native heart without angina pectoris   3. Ischemic cardiomyopathy   4. Essential hypertension   5. Hyperlipidemia, unspecified hyperlipidemia type      PLAN:  In order of problems listed above:  1. Atrial flutter: Patient remains in atrial flutter at this time, heart rate is better controlled after dividing carvedilol to twice daily dosing and add low-dose digoxin for both rate control and heart failure.  He missed 1 day of Eliquis after running out of medication, I have refilled Eliquis.  If he remains in atrial flutter on the next visit, consider outpatient DC cardioversion if he is compliant with Eliquis therapy.  2. CAD s/p CABG: On Plavix, ran out of Plavix a day ago, I have refilled Plavix during today's visit.  3. Ischemic cardiomyopathy: Continue carvedilol, I was unable to add on spironolactone.  Continue digoxin.  Will need to repeat echocardiogram in 2 to 8387-month for reassessment of ejection fraction, if remain low, will need to consider ICD  4. Hypertension: Blood pressure improved compared to previous visit, however continue to be borderline.  5. Hyperlipidemia: On Lipitor 40 mg daily.  Previous lab work obtained on 09/22/2017 showed well-controlled her total cholesterol, LDL and the triglyceride, borderline low HDL.    Medication Adjustments/Labs and Tests Ordered: Current medicines are reviewed at length with the patient today.  Concerns regarding medicines are outlined above.  Medication changes, Labs and Tests ordered today are listed in the Patient Instructions below. Patient Instructions  Medication Instructions:  Your physician recommends that you continue on your current medications as directed. Please refer to the Current Medication list given to you today.  Labwork: None   Testing/Procedures: None   Follow-Up: Keep upcoming appointment with Dr Katrinka BlazingSmith  Any Other Special Instructions Will Be Listed Below (If Applicable). If  you need a refill on your cardiac medications before your next appointment, please call your pharmacy.     Ramond DialSigned, Deserea Bordley, [place]  11/02/2017 11:54 PM    [facility] [address], [place], [place]  [address] Phone: [phone]; Fax: [phone]

## 2017-11-01 ENCOUNTER — Ambulatory Visit: Payer: Medicare Other | Admitting: Cardiology

## 2017-11-02 ENCOUNTER — Encounter: Payer: Self-pay | Admitting: Physician Assistant

## 2017-12-05 ENCOUNTER — Encounter: Payer: Self-pay | Admitting: Interventional Cardiology

## 2017-12-05 ENCOUNTER — Encounter (INDEPENDENT_AMBULATORY_CARE_PROVIDER_SITE_OTHER): Payer: Self-pay

## 2017-12-05 ENCOUNTER — Ambulatory Visit: Payer: Medicare Other | Admitting: Interventional Cardiology

## 2017-12-05 VITALS — BP 94/62 | HR 75 | Ht 74.0 in | Wt 215.1 lb

## 2017-12-05 DIAGNOSIS — I483 Typical atrial flutter: Secondary | ICD-10-CM | POA: Diagnosis not present

## 2017-12-05 DIAGNOSIS — I255 Ischemic cardiomyopathy: Secondary | ICD-10-CM | POA: Diagnosis not present

## 2017-12-05 DIAGNOSIS — I25709 Atherosclerosis of coronary artery bypass graft(s), unspecified, with unspecified angina pectoris: Secondary | ICD-10-CM

## 2017-12-05 DIAGNOSIS — I5022 Chronic systolic (congestive) heart failure: Secondary | ICD-10-CM | POA: Diagnosis not present

## 2017-12-05 DIAGNOSIS — E785 Hyperlipidemia, unspecified: Secondary | ICD-10-CM

## 2017-12-05 DIAGNOSIS — Z7901 Long term (current) use of anticoagulants: Secondary | ICD-10-CM

## 2017-12-05 DIAGNOSIS — I10 Essential (primary) hypertension: Secondary | ICD-10-CM

## 2017-12-05 NOTE — Patient Instructions (Addendum)
Medication Instructions:  Your physician recommends that you continue on your current medications as directed. Please refer to the Current Medication list given to you today.  Labwork: BMET, Pro BNP and Digoxin level today  Testing/Procedures: None  Follow-Up: Your physician recommends that you schedule a follow-up appointment in: 3 weeks with a PA or NP. (Ok to use held slot the first week of September)   Any Other Special Instructions Will Be Listed Below (If Applicable).  Please call the office with a list of the medications you are taking.    If you need a refill on your cardiac medications before your next appointment, please call your pharmacy.

## 2017-12-05 NOTE — Progress Notes (Signed)
Cardiology Office Note:    Date:  12/05/2017   ID:  Harold English, DOB 1940/08/18, MRN [id number]  PCP:  Manon Hilding, MD  Cardiologist:  Sinclair Grooms, MD   Referring MD: No ref. provider found   Chief Complaint  Patient presents with  . Coronary Artery Disease  . Congestive Heart Failure    History of Present Illness:    Harold English is a 77 y.o. male with a hx of complex coronary disease with ischemic cardiomyopathy, LVEF less than 25%, recent decompensated heart failure related to atrial fibrillation and subsequently atrial flutter, heart failure in the setting of atrial flutter with poor ventricular rate control, hypertension, patent coronary graft with patent sequential SVG to ramus and obtuse marginal, totally occluded LAD and RCA, high-grade obstruction and proximal circumflex treated with DES June 2019, Plavix and Eliquis without bleeding complications.    I have never seen the patient prior to this office visit.  He has been managed primarily by APP, Billey Chang.Roney Jaffe he is doing relatively well.  He denies chest pain, orthopnea, PND, and syncope.  Has mild lower extremity swelling.  No particular medication complications.  Does not have a clear understanding of his medication regimen or specifically what he is taking or how it is being administered.  I believe he is on amiodarone, Lipitor, Coreg, Plavix, Lanoxin, Antivert, and Pacerone.  Past Medical History:  Diagnosis Date  . Atypical chest pain 09/16/2017  . CAD (coronary artery disease)   . Coronary artery disease involving coronary bypass graft of native heart with angina pectoris ([place]) 11/17/2009   Qualifier: Diagnosis of  By: Domenic Polite, MD, Phillips Hay   . Essential hypertension, benign 11/17/2009   Qualifier: Diagnosis of  By: Domenic Polite, MD, Phillips Hay   . GERD (gastroesophageal reflux disease)   . Hyperlipidemia   . Hypertension   . Ischemic cardiomyopathy   . MI (myocardial  infarction) ([place])   . Mixed hyperlipidemia 11/17/2009   Qualifier: Diagnosis of  By: Domenic Polite, MD, Phillips Hay   . Renal insufficiency   . Typical atrial flutter ([place]) 09/21/2017  . UNSPECIFIED SECONDARY CARDIOMYOPATHY 11/17/2009   Qualifier: Diagnosis of  By: Domenic Polite, MD, Phillips Hay   . Ventricular tachyarrhythmia ([place]) 09/16/2017  . Vertigo 09/21/2017    Past Surgical History:  Procedure Laterality Date  . ADENOIDECTOMY    . BACK SURGERY    . CARDIAC SURGERY    . CORONARY ARTERY BYPASS GRAFT    . CORONARY STENT INTERVENTION N/A 09/18/2017   Procedure: CORONARY STENT INTERVENTION;  Surgeon: Sherren Mocha, MD;  Location: [place] CV LAB;  Service: Cardiovascular;  Laterality: N/A;  . LEFT HEART CATH AND CORS/GRAFTS ANGIOGRAPHY N/A 09/18/2017   Procedure: LEFT HEART CATH AND CORS/GRAFTS ANGIOGRAPHY;  Surgeon: Sherren Mocha, MD;  Location: [place] CV LAB;  Service: Cardiovascular;  Laterality: N/A;  . TONSILLECTOMY      Current Medications: Current Meds  Medication Sig  . apixaban (ELIQUIS) 5 MG TABS tablet Take 1 tablet (5 mg total) by mouth 2 (two) times daily.  Marland Kitchen atorvastatin (LIPITOR) 40 MG tablet Take 1 tablet (40 mg total) by mouth at bedtime.  . carvedilol (COREG) 6.25 MG tablet Take 1 tablet (6.25 mg total) by mouth 2 (two) times daily with a meal.  . clopidogrel (PLAVIX) 75 MG tablet Take 1 tablet (75 mg total) by mouth daily with breakfast.  . digoxin (LANOXIN) 0.125 MG tablet Take 0.5 tablets (0.0625  mg total) by mouth daily.  . meclizine (ANTIVERT) 12.5 MG tablet Take 1 tablet (12.5 mg total) by mouth 3 (three) times daily.     Allergies:   Sulfonamide derivatives   Social History   Socioeconomic History  . Marital status: Married    Spouse name: Not on file  . Number of children: Not on file  . Years of education: Not on file  . Highest education level: Not on file  Occupational History    Comment: Retired  Scientific laboratory technician  . Financial  resource strain: Not on file  . Food insecurity:    Worry: Not on file    Inability: Not on file  . Transportation needs:    Medical: Not on file    Non-medical: Not on file  Tobacco Use  . Smoking status: Former Smoker    Packs/day: 0.75    Years: 0.00    Pack years: 0.00    Types: Cigarettes    Last attempt to quit: 09/19/2017    Years since quitting: 0.2  . Smokeless tobacco: Never Used  Substance and Sexual Activity  . Alcohol use: Yes    Comment: 1 drink every 3 days   . Drug use: No  . Sexual activity: Not on file  Lifestyle  . Physical activity:    Days per week: Not on file    Minutes per session: Not on file  . Stress: Not on file  Relationships  . Social connections:    Talks on phone: Not on file    Gets together: Not on file    Attends religious service: Not on file    Active member of club or organization: Not on file    Attends meetings of clubs or organizations: Not on file    Relationship status: Not on file  Other Topics Concern  . Not on file  Social History Narrative  . Not on file     Family History: The patient's family history includes CAD in his father.  ROS:   Please see the history of present illness.    He is under stress as his wife has uterine cancer that is metastatic and she is on hospice care.  Much of the daily chores at home or done by him.  He is fatigued quite a bit.  Not sleeping well.  All other systems reviewed and are negative.  EKGs/Labs/Other Studies Reviewed:    The following studies were reviewed today:  Cardiac catheterization and intervention 09/18/2017: Diagnostic Diagram       Post-Intervention Diagram         2D Doppler echocardiogram 09/18/2017: Study Conclusions  - Left ventricle: The cavity size was normal. There was mild   concentric hypertrophy. Systolic function was severely reduced.   The estimated ejection fraction was in the range of 20% to 25%.   Diffuse hypokinesis with apical akinesis.  Doppler parameters are   consistent with high ventricular filling pressure. - Aortic valve: Transvalvular velocity was within the normal range.   There was no stenosis. There was no regurgitation. - Mitral valve: Transvalvular velocity was within the normal range.   There was no evidence for stenosis. There was mild regurgitation. - Left atrium: The atrium was moderately dilated. - Right ventricle: The cavity size was normal. Wall thickness was   normal. Systolic function was normal. - Right atrium: The atrium was severely dilated. - Tricuspid valve: There was mild regurgitation. - Pulmonary arteries: Systolic pressure was mildly to moderately   increased.  PA peak pressure: 49 mm Hg (S).   EKG:  EKG is not ordered today.    Recent Labs: 09/21/2017: ALT 32; Magnesium 2.0; TSH 3.315 09/22/2017: BUN 11; Creatinine, Ser 1.39; Potassium 3.8; Sodium 139 09/26/2017: Hemoglobin 16.1; Platelets 247  Recent Lipid Panel    Component Value Date/Time   CHOL 108 09/22/2017 0637   TRIG 63 09/22/2017 0637   HDL 38 (L) 09/22/2017 0637   CHOLHDL 2.8 09/22/2017 0637   VLDL 13 09/22/2017 0637   LDLCALC 57 09/22/2017 0637    Physical Exam:    VS:  BP 94/62   Pulse 75   Ht 6' 2" (1.88 m)   Wt 215 lb 1.9 oz (97.6 kg)   BMI 27.62 kg/m     Wt Readings from Last 3 Encounters:  12/05/17 215 lb 1.9 oz (97.6 kg)  10/31/17 211 lb 3.2 oz (95.8 kg)  09/26/17 210 lb (95.3 kg)     GEN: Elderly well nourished, well developed in no acute distress HEENT: Normal NECK: No JVD. LYMPHATICS: No lymphadenopathy CARDIAC: II rapid RR, no murmur, no gallop, 1-2+ bilateral ankle to mid shin edema. VASCULAR: 2+ bilateral radial pulses.  No bruits. RESPIRATORY:  Clear to auscultation without rales, wheezing or rhonchi  ABDOMEN: Soft, non-tender, non-distended, No pulsatile mass, MUSCULOSKELETAL: No deformity  SKIN: Warm and dry NEUROLOGIC:  Alert and oriented x 3 PSYCHIATRIC:  Normal affect   ASSESSMENT:     1. Typical atrial flutter ([place])   2. Coronary artery disease involving coronary bypass graft of native heart with angina pectoris ([place])   3. Ischemic cardiomyopathy   4. Chronic systolic heart failure ([place])   5. Hyperlipidemia, unspecified hyperlipidemia type   6. Essential hypertension   7. Anticoagulation adequate    PLAN:    In order of problems listed above:  1. Electrical cardioversion after an adequate loading.  On amiodarone based upon chart but patient does not appear to be familiar with the medication..  Hope to discontinue digoxin therapy.  Check dig level, B met, and BNP today.  3-week follow-up with APP after we verify if medications are being taken correctly.  If they are, at that time an elective electrical cardioversion will be arranged.  Subsequent to that we will optimize heart failure therapy by adding angiotensin system blockade, optimize beta-blocker therapy, and consider mineralocorticoid receptor blocker therapy.  May need AICD. 2. Continue aggressive risk factor modification 3. Optimize guideline directed medical therapy: Beta-blocker, ARNI, and MRA as tolerated by blood pressure after atrial fibrillation rate better controlled and are hopefully conversion to sinus rhythm.  Currently unable to do very much because of very low blood pressure which compromises attempts at medical therapy and is a poor prognostic indicator. 4. As discussed above. 5. Keep LDL less than 70. 6. Target blood pressure less than 130/80.  Maintain greater than 90 mmHg systolic blood pressure 7. Continue apixaban.  Follow-up 3 weeks.  Consider elective electrical cardioversion set up at that time if we document the patient is taking medications correctly.   Medication Adjustments/Labs and Tests Ordered: Current medicines are reviewed at length with the patient today.  Concerns regarding medicines are outlined above.  Orders Placed This Encounter  Procedures  . Basic metabolic panel  . Pro  b natriuretic peptide  . Digoxin level   No orders of the defined types were placed in this encounter.   Patient Instructions  Medication Instructions:  Your physician recommends that you continue on your current medications as directed.  Please refer to the Current Medication list given to you today.  Labwork: BMET, Pro BNP and Digoxin level today  Testing/Procedures: None  Follow-Up: Your physician recommends that you schedule a follow-up appointment in: 3 weeks with a PA or NP. (Ok to use held slot the first week of September)   Any Other Special Instructions Will Be Listed Below (If Applicable).  Please call the office with a list of the medications you are taking.    If you need a refill on your cardiac medications before your next appointment, please call your pharmacy.      Signed, Sinclair Grooms, MD  12/05/2017 6:05 PM    [place]

## 2017-12-06 LAB — BASIC METABOLIC PANEL
BUN/Creatinine Ratio: 9 — ABNORMAL LOW (ref 10–24)
BUN: 12 mg/dL (ref 8–27)
CO2: 25 mmol/L (ref 20–29)
Calcium: 8.8 mg/dL (ref 8.6–10.2)
Chloride: 108 mmol/L — ABNORMAL HIGH (ref 96–106)
Creatinine, Ser: 1.39 mg/dL — ABNORMAL HIGH (ref 0.76–1.27)
GFR calc Af Amer: 57 mL/min/{1.73_m2} — ABNORMAL LOW (ref >59)
GFR calc non Af Amer: 49 mL/min/{1.73_m2} — ABNORMAL LOW (ref >59)
GLUCOSE: 125 mg/dL — AB (ref 65–99)
Potassium: 4.1 mmol/L (ref 3.5–5.2)
Sodium: 145 mmol/L — ABNORMAL HIGH (ref 134–144)

## 2017-12-06 LAB — PRO B NATRIURETIC PEPTIDE: NT-PRO BNP: 4749 pg/mL — AB (ref 0–486)

## 2017-12-06 LAB — DIGOXIN LEVEL

## 2017-12-27 ENCOUNTER — Ambulatory Visit: Payer: Medicare Other | Admitting: Cardiology

## 2017-12-28 ENCOUNTER — Encounter: Payer: Self-pay | Admitting: *Deleted

## 2018-01-02 ENCOUNTER — Telehealth: Payer: Self-pay | Admitting: Interventional Cardiology

## 2018-01-02 NOTE — Telephone Encounter (Signed)
New Message: ° ° ° °Patient is calling for lab results  °

## 2018-01-03 NOTE — Telephone Encounter (Signed)
Spoke with pt and went over recommendations per Dr. Katrinka BlazingSmith.  Pt is scheduled to see Nada BoozerLaura Ingold, NP on 9/17.

## 2018-01-08 ENCOUNTER — Telehealth: Payer: Self-pay

## 2018-01-08 ENCOUNTER — Encounter: Payer: Self-pay | Admitting: Cardiology

## 2018-01-08 ENCOUNTER — Telehealth: Payer: Self-pay | Admitting: Cardiovascular Disease

## 2018-01-08 ENCOUNTER — Encounter: Payer: Self-pay | Admitting: *Deleted

## 2018-01-08 ENCOUNTER — Ambulatory Visit: Payer: Medicare Other | Admitting: Cardiology

## 2018-01-08 VITALS — BP 94/60 | HR 77 | Ht 74.0 in | Wt 214.8 lb

## 2018-01-08 DIAGNOSIS — F4321 Adjustment disorder with depressed mood: Secondary | ICD-10-CM

## 2018-01-08 DIAGNOSIS — I10 Essential (primary) hypertension: Secondary | ICD-10-CM | POA: Diagnosis not present

## 2018-01-08 DIAGNOSIS — I255 Ischemic cardiomyopathy: Secondary | ICD-10-CM | POA: Diagnosis not present

## 2018-01-08 DIAGNOSIS — Z7901 Long term (current) use of anticoagulants: Secondary | ICD-10-CM

## 2018-01-08 DIAGNOSIS — I2510 Atherosclerotic heart disease of native coronary artery without angina pectoris: Secondary | ICD-10-CM

## 2018-01-08 DIAGNOSIS — I25810 Atherosclerosis of coronary artery bypass graft(s) without angina pectoris: Secondary | ICD-10-CM | POA: Diagnosis not present

## 2018-01-08 DIAGNOSIS — I480 Paroxysmal atrial fibrillation: Secondary | ICD-10-CM

## 2018-01-08 NOTE — Telephone Encounter (Signed)
Pt states that he cancelled his DCCV d/t cost.  Someone from hospital called him and said cost would be $250.  Pt states he does not have the money at this time because of having to pay for wife's funeral recently.  Pt states he refuses to go into debt over the procedure.  Advised I would send message to Nada BoozerLaura Ingold, NP.

## 2018-01-08 NOTE — Telephone Encounter (Signed)
Will forward to Vernona RiegerLaura I PA who saw patient, this is a Dr Katrinka BlazingSmith patient not Dr Duke Salviaandolph

## 2018-01-08 NOTE — Patient Instructions (Addendum)
Medication Instructions: No change    Labwork: none ordered    Testing/Procedures: Your physician has recommended that you have a Cardioversion (DCCV). Electrical Cardioversion uses a jolt of electricity to your heart either through paddles or wired patches attached to your chest. This is a controlled, usually prescheduled, procedure. Defibrillation is done under light anesthesia in the hospital, and you usually go home the day of the procedure. This is done to get your heart back into a normal rhythm. You are not awake for the procedure. Please see the instruction sheet given to you today.  January 16, 2018  Wed.     Follow-Up: 2 weeks after Cardioversion on 01/16/18  With App...      Any Other Special Instructions Will Be Listed Below (If Applicable).     If you need a refill on your cardiac medications before your next appointment, please call your pharmacy.

## 2018-01-08 NOTE — Telephone Encounter (Signed)
Left another message for pt to call back re: his cardioversion... [facility] earlier to report that he is now wanting to cancel his cardioversion.

## 2018-01-08 NOTE — Telephone Encounter (Signed)
Left message for pt per Nada BoozerLaura Ingold to hold his Digoxin the morning of his cardioversion.. On 01/16/18 at 1:45pm.

## 2018-01-08 NOTE — Progress Notes (Signed)
Cardiology Office Note   Date:  01/08/2018   ID:  Harold English, DOB 23-Dec-1940, MRN [id number]  PCP:  Estanislado PandySasser, Paul W, MD  Cardiologist:  Dr. Katrinka BlazingSmith     Chief Complaint  Patient presents with  . Atrial Fibrillation      History of Present Illness: Harold English is a 77 y.o. male who presents for a fib and DCCV  hx of complex coronary disease with ischemic cardiomyopathy, LVEF less than 25%, recent decompensated heart failure related to atrial fibrillation and subsequently atrial flutter, heart failure in the setting of atrial flutter with poor ventricular rate control, hypertension, patent coronary graft with patent sequential SVG to ramus and obtuse marginal, totally occluded LAD and RCA, high-grade obstruction and proximal circumflex treated with DES June 2019, Plavix and Eliquis without bleeding complications.    Saw Dr. Katrinka BlazingSmith 12/05/17 plan for DCCV after adequate loading and hope to stop dig. Per Dr Katrinka BlazingSmith "  Subsequent to that we will optimize heart failure therapy by adding angiotensin system blockade, optimize beta-blocker therapy, and consider mineralocorticoid receptor blocker therapy.  May need AICD. "  Today pt has no chest pain or SOB, he is grieving-- his wife died in August, she had cancer.  They had been married 55 years.  He is trying to stay busy.  [place]    He remains in a fib, rate controlled.  Discussed DCCV at length.    BP is lower but no dizziness no lightheadedness.     Past Medical History:  Diagnosis Date  . Atypical chest pain 09/16/2017  . CAD (coronary artery disease)   . Coronary artery disease involving coronary bypass graft of native heart with angina pectoris (HCC) 11/17/2009   Qualifier: Diagnosis of  By: Diona BrownerMcDowell, MD, Hollace HaywardFACC, Samuel Grainger   . Essential hypertension, benign 11/17/2009   Qualifier: Diagnosis of  By: Diona BrownerMcDowell, MD, Hollace HaywardFACC, Samuel Grainger   . GERD (gastroesophageal reflux  disease)   . Hyperlipidemia   . Hypertension   . Ischemic cardiomyopathy   . MI (myocardial infarction) (HCC)   . Mixed hyperlipidemia 11/17/2009   Qualifier: Diagnosis of  By: Diona BrownerMcDowell, MD, Hollace HaywardFACC, Samuel Grainger   . Renal insufficiency   . Typical atrial flutter (HCC) 09/21/2017  . UNSPECIFIED SECONDARY CARDIOMYOPATHY 11/17/2009   Qualifier: Diagnosis of  By: Diona BrownerMcDowell, MD, Hollace HaywardFACC, Samuel Grainger   . Ventricular tachyarrhythmia (HCC) 09/16/2017  . Vertigo 09/21/2017    Past Surgical History:  Procedure Laterality Date  . ADENOIDECTOMY    . BACK SURGERY    . CARDIAC SURGERY    . CORONARY ARTERY BYPASS GRAFT    . CORONARY STENT INTERVENTION N/A 09/18/2017   Procedure: CORONARY STENT INTERVENTION;  Surgeon: Tonny Bollmanooper, Michael, MD;  [facility];  Service: Cardiovascular;  Laterality: N/A;  . LEFT HEART CATH AND CORS/GRAFTS ANGIOGRAPHY N/A 09/18/2017   Procedure: LEFT HEART CATH AND CORS/GRAFTS ANGIOGRAPHY;  Surgeon: Tonny Bollmanooper, Michael, MD;  [facility];  Service: Cardiovascular;  Laterality: N/A;  . TONSILLECTOMY       Current Outpatient Medications  Medication Sig Dispense Refill  . amiodarone (PACERONE) 200 MG tablet Take 1 tablet (200 mg total) by mouth daily. 30 tablet 3  . apixaban (ELIQUIS) 5 MG TABS tablet Take 1 tablet (5 mg total) by mouth 2 (two) times daily. 180 tablet 3  . atorvastatin (LIPITOR) 40 MG tablet Take 1 tablet (40 mg total) by  mouth at bedtime. 90 tablet 2  . carvedilol (COREG) 6.25 MG tablet Take 1 tablet (6.25 mg total) by mouth 2 (two) times daily with a meal. 180 tablet 2  . clopidogrel (PLAVIX) 75 MG tablet Take 1 tablet (75 mg total) by mouth daily with breakfast. 90 tablet 30  . digoxin (LANOXIN) 0.125 MG tablet Take 0.5 tablets (0.0625 mg total) by mouth daily. 30 tablet 3  . meclizine (ANTIVERT) 12.5 MG tablet Take 1 tablet (12.5 mg total) by mouth 3 (three) times daily. 90 tablet 0   No current facility-administered medications  for this visit.     Allergies:   Sulfonamide derivatives    Social History:  The patient  reports that he quit smoking about 3 months ago. His smoking use included cigarettes. He smoked 0.75 packs per day for 0.00 years. He has never used smokeless tobacco. He reports that he drinks alcohol. He reports that he does not use drugs.   Family History:  The patient's family history includes CAD in his father.    ROS:  General:no colds or fevers, no weight changes Skin:no rashes or ulcers HEENT:no blurred vision, no congestion CV:see HPI PUL:see HPI GI:no diarrhea constipation or melena, no indigestion GU:no hematuria, no dysuria MS:no joint pain, no claudication Neuro:no syncope, no lightheadedness Endo:no diabetes, no thyroid disease  Wt Readings from Last 3 Encounters:  01/08/18 214 lb 12.8 oz (97.4 kg)  12/05/17 215 lb 1.9 oz (97.6 kg)  10/31/17 211 lb 3.2 oz (95.8 kg)     PHYSICAL EXAM: VS:  BP 94/60   Pulse 77   Ht 6\' 2"  (1.88 m)   Wt 214 lb 12.8 oz (97.4 kg)   SpO2 96%   BMI 27.58 kg/m  , BMI Body mass index is 27.58 kg/m. General:Pleasant affect, NAD Skin:Warm and dry, brisk capillary refill HEENT:normocephalic, sclera clear, mucus membranes moist Neck:supple, no JVD, no bruits  Heart:irreg irreg without murmur, gallup, rub or click Lungs:clear without rales, rhonchi, or wheezes ZOX:WRUE, non tender, + BS, do not palpate liver spleen or masses Ext:no lower ext edema, 2+ pedal pulses, 2+ radial pulses Neuro:alert and oriented X 3, MAE, follows commands, + facial symmetry    EKG:  EKG is ordered today. The ekg ordered today demonstrates  a flutter with variable block .RBBB and old ant septal MI.     Recent Labs: 09/21/2017: ALT 32; Magnesium 2.0; TSH 3.315 09/26/2017: Hemoglobin 16.1; Platelets 247 12/05/2017: BUN 12; Creatinine, Ser 1.39; NT-Pro BNP 4,749; Potassium 4.1; Sodium 145    Lipid Panel    Component Value Date/Time   CHOL 108 09/22/2017 0637    TRIG 63 09/22/2017 0637   HDL 38 (L) 09/22/2017 0637   CHOLHDL 2.8 09/22/2017 0637   VLDL 13 09/22/2017 0637   LDLCALC 57 09/22/2017 0637       Other studies Reviewed: Additional studies/ records that were reviewed today include: Cardiac catheterization and intervention 09/18/2017: Diagnostic Diagram       Post-Intervention Diagram         2D Doppler echocardiogram 09/18/2017: Study Conclusions  - Left ventricle: The cavity size was normal. There was mild concentric hypertrophy. Systolic function was severely reduced. The estimated ejection fraction was in the range of 20% to 25%. Diffuse hypokinesis with apical akinesis. Doppler parameters are consistent with high ventricular filling pressure. - Aortic valve: Transvalvular velocity was within the normal range. There was no stenosis. There was no regurgitation. - Mitral valve: Transvalvular velocity was within the normal range.  There was no evidence for stenosis. There was mild regurgitation. - Left atrium: The atrium was moderately dilated. - Right ventricle: The cavity size was normal. Wall thickness was normal. Systolic function was normal. - Right atrium: The atrium was severely dilated. - Tricuspid valve: There was mild regurgitation. - Pulmonary arteries: Systolic pressure was mildly to moderately increased. PA peak pressure: 49 mm Hg (S).    ASSESSMENT AND PLAN:  1.  atrial fib/ flutter . On dig, amiodarone and coreg.  On eliquis for anticoagulation.  Plan for DCCV discussed at length on procedure pt was agreeable. Continue current meds, to hold dig day of procedure and hope to stop post DCCV.  2.   CAD with CABG and no angina.  Continue plavix  3.  ICM with EF 20-25% - hope once back in SR his EF will improve.  4.  Grief - his wife died of cancer in 12/09/2022.  We discussed his loss and how he is taking care of himself.  He has a sister here in [place] and support system with his  neighbors.    5.  HLD continue statin   6.  Hypotension, is stable without symptoms but cannot increase meds.    Follow up after DCCV   Current medicines are reviewed with the patient today.  The patient Has no concerns regarding medicines.  The following changes have been made:  See above Labs/ tests ordered today include:see above  Disposition:   FU:  see above  Signed, Nada Boozer, NP  01/08/2018 10:43 AM    [facility] [address] [place], [place], [place]  [address]/ [address] [place], [place] Phone: [phone]; Fax: [phone]  [phone]

## 2018-01-08 NOTE — Telephone Encounter (Signed)
New message   Per Onalee HuaDavid the patient states that he wants to cancel the procedure that he has scheduled on 01/16/2018. Please contact Onalee HuaDavid if you need additional information.

## 2018-01-08 NOTE — Telephone Encounter (Signed)
Victorino DikeJennifer, please check with pt to find out why he cancelled DCCV  Thanks.  Vernona RiegerLaura

## 2018-01-09 NOTE — Telephone Encounter (Signed)
I sent to Dr. Katrinka BlazingSmith

## 2018-01-09 NOTE — Telephone Encounter (Signed)
Ok I will let Dr. Katrinka BlazingSmith know   - maybe arrange appt with Dr. Katrinka BlazingSmith or me in 6-8 weeks if he would be able to pay then.

## 2018-01-09 NOTE — Telephone Encounter (Signed)
Left message to call back  

## 2018-01-09 NOTE — Telephone Encounter (Signed)
Spoke with Vernona RiegerLaura, ok to keep appt with Lawson FiscalLori or extend out a few more weeks if pt prefers.  Spoke with pt and he states he wants to talk with his PCP and see about getting DCCV in [place] before he makes any changes.  Pt will keep appt with Lawson FiscalLori for now.  He will contact his PCP and then let me know if we need to adjust his appt.

## 2018-01-09 NOTE — Telephone Encounter (Signed)
See other phone note ./cy 

## 2018-01-09 NOTE — Telephone Encounter (Signed)
Spoke with pt and pt is wanting to cancel procedure.Per pt cannot afford remaining balance after insurance pays.Encouraged pt to keep due to the risk of stroke and could set up a payment plan refuses states has no money had to pay for wife's funeral.Pt states is going to call PMD to see if procedure maybe done somewhere else cheaper. Pt notified if does reconsider to call back  In this 30 day window or will need to come in for another visit Pt verbalizes understanding ./cy  Central scheduling notified of cancellation./cy

## 2018-01-16 ENCOUNTER — Encounter (HOSPITAL_COMMUNITY): Admission: RE | Payer: Self-pay | Source: Ambulatory Visit

## 2018-01-16 ENCOUNTER — Ambulatory Visit (HOSPITAL_COMMUNITY): Admission: RE | Admit: 2018-01-16 | Payer: Medicare Other | Source: Ambulatory Visit | Admitting: Cardiovascular Disease

## 2018-01-16 SURGERY — CARDIOVERSION
Anesthesia: General

## 2018-01-21 ENCOUNTER — Other Ambulatory Visit: Payer: Self-pay | Admitting: Physician Assistant

## 2018-01-30 ENCOUNTER — Ambulatory Visit: Payer: Medicare Other | Admitting: Nurse Practitioner

## 2018-03-01 ENCOUNTER — Emergency Department (HOSPITAL_COMMUNITY)
Admission: EM | Admit: 2018-03-01 | Discharge: 2018-03-02 | Disposition: A | Discharge status: Home / Self Care | Payer: Medicare Other | Attending: Emergency Medicine | Admitting: Emergency Medicine

## 2018-03-01 ENCOUNTER — Encounter (HOSPITAL_COMMUNITY): Payer: Self-pay

## 2018-03-01 ENCOUNTER — Other Ambulatory Visit: Payer: Self-pay

## 2018-03-01 DIAGNOSIS — I129 Hypertensive chronic kidney disease with stage 1 through stage 4 chronic kidney disease, or unspecified chronic kidney disease: Secondary | ICD-10-CM | POA: Insufficient documentation

## 2018-03-01 DIAGNOSIS — I259 Chronic ischemic heart disease, unspecified: Secondary | ICD-10-CM | POA: Insufficient documentation

## 2018-03-01 DIAGNOSIS — Z7902 Long term (current) use of antithrombotics/antiplatelets: Secondary | ICD-10-CM | POA: Diagnosis not present

## 2018-03-01 DIAGNOSIS — N189 Chronic kidney disease, unspecified: Secondary | ICD-10-CM | POA: Diagnosis not present

## 2018-03-01 DIAGNOSIS — Z79899 Other long term (current) drug therapy: Secondary | ICD-10-CM | POA: Diagnosis not present

## 2018-03-01 DIAGNOSIS — R339 Retention of urine, unspecified: Secondary | ICD-10-CM | POA: Diagnosis not present

## 2018-03-01 DIAGNOSIS — Z7901 Long term (current) use of anticoagulants: Secondary | ICD-10-CM | POA: Insufficient documentation

## 2018-03-01 DIAGNOSIS — Z87891 Personal history of nicotine dependence: Secondary | ICD-10-CM | POA: Diagnosis not present

## 2018-03-01 LAB — URINALYSIS, ROUTINE W REFLEX MICROSCOPIC
BILIRUBIN URINE: NEGATIVE
Glucose, UA: NEGATIVE mg/dL
Ketones, ur: NEGATIVE mg/dL
LEUKOCYTES UA: NEGATIVE
Nitrite: NEGATIVE
Protein, ur: NEGATIVE mg/dL
SPECIFIC GRAVITY, URINE: 1.003 — AB (ref 1.005–1.030)
pH: 6 (ref 5.0–8.0)

## 2018-03-01 MED ORDER — TAMSULOSIN HCL 0.4 MG PO CAPS: 0.4000 mg | ORAL_CAPSULE | Freq: Every day | ORAL | 0 refills | Status: AC

## 2018-03-01 NOTE — ED Provider Notes (Signed)
[facility] EMERGENCY DEPARTMENT Provider Note   CSN: [id number] Arrival date & time: 03/01/18  2220     History   Chief Complaint Chief Complaint  Patient presents with  . Urinary Retention    HPI Harold English is a 77 y.o. male.  The history is provided by the patient.  Illness  This is a new problem. The current episode started 3 to 5 hours ago. The problem occurs constantly. The problem has been gradually worsening. Associated symptoms include abdominal pain. Nothing aggravates the symptoms. Nothing relieves the symptoms.  Patient presents with difficulty urinating.  He reports several hours ago he was unable to urinate.  He is otherwise felt well prior to this.  No fevers or vomiting.  No back pain.  He does report he typically has urinary frequency and nocturia  Past Medical History:  Diagnosis Date  . Atypical chest pain 09/16/2017  . CAD (coronary artery disease)   . Coronary artery disease involving coronary bypass graft of native heart with angina pectoris (HCC) 11/17/2009   Qualifier: Diagnosis of  By: Diona Browner, MD, Hollace Hayward   . Essential hypertension, benign 11/17/2009   Qualifier: Diagnosis of  By: Diona Browner, MD, Hollace Hayward   . GERD (gastroesophageal reflux disease)   . Hyperlipidemia   . Hypertension   . Ischemic cardiomyopathy   . MI (myocardial infarction) (HCC)   . Mixed hyperlipidemia 11/17/2009   Qualifier: Diagnosis of  By: Diona Browner, MD, Hollace Hayward   . Renal insufficiency   . Typical atrial flutter (HCC) 09/21/2017  . UNSPECIFIED SECONDARY CARDIOMYOPATHY 11/17/2009   Qualifier: Diagnosis of  By: Diona Browner, MD, Hollace Hayward   . Ventricular tachyarrhythmia (HCC) 09/16/2017  . Vertigo 09/21/2017    Patient Active Problem List   Diagnosis Date Noted  . Vertigo 09/21/2017  . Typical atrial flutter (HCC) 09/21/2017  . Atypical chest pain 09/16/2017  . Ventricular tachyarrhythmia (HCC) 09/16/2017  . Ischemic  cardiomyopathy 01/10/2010  . Mixed hyperlipidemia 11/17/2009  . TOBACCO ABUSE 11/17/2009  . Essential hypertension, benign 11/17/2009  . Coronary artery disease involving coronary bypass graft of native heart with angina pectoris (HCC) 11/17/2009  . UNSPECIFIED SECONDARY CARDIOMYOPATHY 11/17/2009  . SHOULDER PAIN 06/07/2009  . IMPINGEMENT SYNDROME 06/07/2009    Past Surgical History:  Procedure Laterality Date  . ADENOIDECTOMY    . BACK SURGERY    . CARDIAC SURGERY    . CORONARY ARTERY BYPASS GRAFT    . CORONARY STENT INTERVENTION N/A 09/18/2017   Procedure: CORONARY STENT INTERVENTION;  Surgeon: Tonny Bollman, MD;  Location: [facility] INVASIVE CV LAB;  Service: Cardiovascular;  Laterality: N/A;  . LEFT HEART CATH AND CORS/GRAFTS ANGIOGRAPHY N/A 09/18/2017   Procedure: LEFT HEART CATH AND CORS/GRAFTS ANGIOGRAPHY;  Surgeon: Tonny Bollman, MD;  Location: [facility] INVASIVE CV LAB;  Service: Cardiovascular;  Laterality: N/A;  . TONSILLECTOMY          Home Medications    Prior to Admission medications   Medication Sig Start Date End Date Taking? Authorizing Provider  amiodarone (PACERONE) 200 MG tablet TAKE 1 TABLET BY MOUTH EVERY DAY 01/21/18   Azalee Course, PA  apixaban (ELIQUIS) 5 MG TABS tablet Take 1 tablet (5 mg total) by mouth 2 (two) times daily. 10/31/17   Azalee Course, PA  atorvastatin (LIPITOR) 40 MG tablet Take 1 tablet (40 mg total) by mouth at bedtime. 10/24/17   Azalee Course, PA  carvedilol (COREG) 6.25 MG tablet Take 1 tablet (6.25 mg total) by mouth  2 (two) times daily with a meal. 10/24/17   Azalee Course, PA  clopidogrel (PLAVIX) 75 MG tablet Take 1 tablet (75 mg total) by mouth daily with breakfast. 10/31/17   Azalee Course, PA  digoxin (LANOXIN) 0.125 MG tablet Take 0.5 tablets (0.0625 mg total) by mouth daily. 09/26/17   Azalee Course, PA  meclizine (ANTIVERT) 12.5 MG tablet Take 1 tablet (12.5 mg total) by mouth 3 (three) times daily. 09/22/17   Vassie Loll, MD  tamsulosin (FLOMAX) 0.4 MG CAPS capsule  Take 1 capsule (0.4 mg total) by mouth daily after supper for 10 days. 03/01/18 03/11/18  Zadie Rhine, MD    Family History Family History  Problem Relation Age of Onset  . CAD Father     Social History Social History   Tobacco Use  . Smoking status: Former Smoker    Packs/day: 0.75    Years: 0.00    Pack years: 0.00    Types: Cigarettes    Last attempt to quit: 09/19/2017    Years since quitting: 0.4  . Smokeless tobacco: Never Used  Substance Use Topics  . Alcohol use: Yes    Comment: 1 drink every 3 days   . Drug use: No     Allergies   Sulfonamide derivatives   Review of Systems Review of Systems  Constitutional: Negative for fever.  Gastrointestinal: Positive for abdominal pain.  Genitourinary: Positive for difficulty urinating.  All other systems reviewed and are negative.    Physical Exam Updated Vital Signs BP (!) 132/94 (BP Location: Left Arm)   Pulse (!) 124   Temp 97.7 F (36.5 C) (Oral)   Resp 18   Ht 1.88 m (6\' 2" )   Wt 95.3 kg   SpO2 96%   BMI 26.96 kg/m   Physical Exam CONSTITUTIONAL: Well developed/well nourished HEAD: Normocephalic/atraumatic EYES: EOM ENMT: Mucous membranes moist NECK: supple no meningeal signs CV: S1/S2 noted, no murmurs/rubs/gallops noted LUNGS: Lungs are clear to auscultation bilaterally, no apparent distress ABDOMEN: soft, nontender, no rebound or guarding, bowel sounds noted throughout abdomen Catheter was in place on my evaluation.  Patient declined rectal/prostate exam GU:no cva tenderness NEURO: Pt is awake/alert/appropriate, moves all extremitiesx4.  No facial droop.   EXTREMITIES: pulses normal/equal, full ROM SKIN: warm, color normal PSYCH: no abnormalities of mood noted, alert and oriented to situation   ED Treatments / Results  Labs (all labs ordered are listed, but only abnormal results are displayed) Labs Reviewed  URINALYSIS, ROUTINE W REFLEX MICROSCOPIC - Abnormal; Notable for the  following components:      Result Value   Color, Urine STRAW (*)    Specific Gravity, Urine 1.003 (*)    Hgb urine dipstick LARGE (*)    Bacteria, UA RARE (*)    All other components within normal limits  URINE CULTURE    EKG None  Radiology No results found.  Procedures Procedures (including critical care time)  Medications Ordered in ED Medications - No data to display   Initial Impression / Assessment and Plan / ED Course  I have reviewed the triage vital signs and the nursing notes.  Pertinent labs results that were available during my care of the patient were reviewed by me and considered in my medical decision making (see chart for details).     11:43 PM Patient presents with urinary retention.  Likely due to enlarged prostate.  However he does decline prostate exam.  By the time of evaluation he was already improved with a  Foley catheter. We will start him on Flomax, refer to urology.  He will go home with Foley leg bag in place.  Nursing has provided teaching.    Patient feels comfortable for discharge. BP 96/69 (BP Location: Right Arm)   Pulse 95   Temp 97.7 F (36.5 C) (Oral)   Resp 16   Ht 1.88 m (6\' 2" )   Wt 95.3 kg   SpO2 92%   BMI 26.96 kg/m  I have ordered Flomax, he understands this may cause drops in blood pressure.  Advised him to hold this if he feels lightheaded or has low blood pressure at home.  Final Clinical Impressions(s) / ED Diagnoses   Final diagnoses:  Urinary retention    ED Discharge Orders         Ordered    tamsulosin (FLOMAX) 0.4 MG CAPS capsule  Daily after supper     03/01/18 2319           Zadie Rhine, MD 03/02/18 0005

## 2018-03-01 NOTE — ED Triage Notes (Signed)
Pt states for the past couple of hours he has felt like he needs to urinate but has been unable to pass his water.  Pt denies prostate problems or previous problems with same.

## 2018-03-03 LAB — URINE CULTURE
Culture: NO GROWTH
SPECIAL REQUESTS: NORMAL

## 2018-03-12 ENCOUNTER — Ambulatory Visit: Payer: Medicare Other | Admitting: Urology

## 2018-03-12 DIAGNOSIS — N401 Enlarged prostate with lower urinary tract symptoms: Secondary | ICD-10-CM

## 2018-03-13 ENCOUNTER — Ambulatory Visit (INDEPENDENT_AMBULATORY_CARE_PROVIDER_SITE_OTHER): Payer: Medicare Other | Admitting: Urology

## 2018-03-13 DIAGNOSIS — R3912 Poor urinary stream: Secondary | ICD-10-CM | POA: Diagnosis not present

## 2018-03-13 DIAGNOSIS — B356 Tinea cruris: Secondary | ICD-10-CM

## 2018-03-13 DIAGNOSIS — R3914 Feeling of incomplete bladder emptying: Secondary | ICD-10-CM | POA: Diagnosis not present

## 2018-03-13 DIAGNOSIS — N401 Enlarged prostate with lower urinary tract symptoms: Secondary | ICD-10-CM

## 2018-05-22 ENCOUNTER — Other Ambulatory Visit: Payer: Self-pay | Admitting: Physician Assistant

## 2018-06-12 ENCOUNTER — Ambulatory Visit: Payer: Medicare Other | Admitting: Urology

## 2018-06-12 DIAGNOSIS — N401 Enlarged prostate with lower urinary tract symptoms: Secondary | ICD-10-CM

## 2018-06-12 DIAGNOSIS — R339 Retention of urine, unspecified: Secondary | ICD-10-CM

## 2018-06-19 ENCOUNTER — Ambulatory Visit (INDEPENDENT_AMBULATORY_CARE_PROVIDER_SITE_OTHER): Payer: Medicare Other | Admitting: Urology

## 2018-06-19 DIAGNOSIS — R339 Retention of urine, unspecified: Secondary | ICD-10-CM | POA: Diagnosis not present

## 2018-06-24 ENCOUNTER — Other Ambulatory Visit: Payer: Self-pay | Admitting: Physician Assistant

## 2018-07-15 ENCOUNTER — Other Ambulatory Visit: Payer: Self-pay

## 2018-07-15 MED ORDER — AMIODARONE HCL 200 MG PO TABS: 200.0000 mg | ORAL_TABLET | Freq: Every day | ORAL | 1 refills | Status: DC

## 2018-08-07 ENCOUNTER — Ambulatory Visit (INDEPENDENT_AMBULATORY_CARE_PROVIDER_SITE_OTHER): Payer: Medicare Other | Admitting: Urology

## 2018-08-07 DIAGNOSIS — R339 Retention of urine, unspecified: Secondary | ICD-10-CM

## 2018-08-29 ENCOUNTER — Other Ambulatory Visit: Payer: Self-pay | Admitting: Urology

## 2018-09-10 ENCOUNTER — Ambulatory Visit (INDEPENDENT_AMBULATORY_CARE_PROVIDER_SITE_OTHER): Payer: Medicare Other | Admitting: Urology

## 2018-09-10 DIAGNOSIS — R339 Retention of urine, unspecified: Secondary | ICD-10-CM | POA: Diagnosis not present

## 2018-09-24 ENCOUNTER — Other Ambulatory Visit: Payer: Self-pay | Admitting: Physician Assistant

## 2018-10-01 ENCOUNTER — Encounter (HOSPITAL_COMMUNITY): Payer: Self-pay

## 2018-10-01 ENCOUNTER — Other Ambulatory Visit: Payer: Self-pay

## 2018-10-01 DIAGNOSIS — Z01812 Encounter for preprocedural laboratory examination: Secondary | ICD-10-CM | POA: Diagnosis not present

## 2018-10-01 DIAGNOSIS — Z1159 Encounter for screening for other viral diseases: Secondary | ICD-10-CM | POA: Diagnosis not present

## 2018-10-02 ENCOUNTER — Encounter (HOSPITAL_COMMUNITY)
Admission: RE | Admit: 2018-10-02 | Discharge: 2018-10-02 | Disposition: A | Discharge status: Home / Self Care | Payer: Medicare Other | Source: Ambulatory Visit | Attending: Urology | Admitting: Urology

## 2018-10-02 DIAGNOSIS — Z01812 Encounter for preprocedural laboratory examination: Secondary | ICD-10-CM | POA: Insufficient documentation

## 2018-10-02 DIAGNOSIS — Z1159 Encounter for screening for other viral diseases: Secondary | ICD-10-CM | POA: Insufficient documentation

## 2018-10-03 ENCOUNTER — Other Ambulatory Visit (HOSPITAL_COMMUNITY)
Admission: RE | Admit: 2018-10-03 | Discharge: 2018-10-03 | Disposition: A | Discharge status: Home / Self Care | Payer: Medicare Other | Source: Ambulatory Visit | Attending: Urology | Admitting: Urology

## 2018-10-03 ENCOUNTER — Other Ambulatory Visit: Payer: Self-pay

## 2018-10-03 DIAGNOSIS — Z01812 Encounter for preprocedural laboratory examination: Secondary | ICD-10-CM | POA: Diagnosis not present

## 2018-10-04 LAB — NOVEL CORONAVIRUS, NAA (HOSP ORDER, SEND-OUT TO REF LAB; TAT 18-24 HRS): SARS-CoV-2, NAA: NOT DETECTED

## 2018-10-04 NOTE — OR Nursing (Signed)
Spoke with Estill Bamberg ( Dr. Alyson Ingles Nurse) about Mr. Harold English in regards to [name].   She stated that Harold English will contact patient and fax information to [facility] this evening to give permission to procedue with procedure. And Harold English instructions.  [facility] is still waiting on clearence to proceed with surgery.  FYI, Patient stated at time of PAT that he was instructed to take last dose Friday 10/04/2018

## 2018-10-07 ENCOUNTER — Encounter (HOSPITAL_COMMUNITY)
Admission: RE | Disposition: A | Discharge status: Home / Self Care | Payer: Self-pay | Source: Home / Self Care | Attending: Urology

## 2018-10-07 ENCOUNTER — Ambulatory Visit (HOSPITAL_COMMUNITY): Payer: Medicare Other | Admitting: Anesthesiology

## 2018-10-07 ENCOUNTER — Other Ambulatory Visit: Payer: Self-pay

## 2018-10-07 ENCOUNTER — Ambulatory Visit (HOSPITAL_COMMUNITY)
Admission: RE | Admit: 2018-10-07 | Discharge: 2018-10-07 | Disposition: A | Discharge status: Home / Self Care | Payer: Medicare Other | Attending: Urology | Admitting: Urology

## 2018-10-07 ENCOUNTER — Encounter (HOSPITAL_COMMUNITY): Payer: Self-pay

## 2018-10-07 DIAGNOSIS — I2510 Atherosclerotic heart disease of native coronary artery without angina pectoris: Secondary | ICD-10-CM | POA: Diagnosis not present

## 2018-10-07 DIAGNOSIS — I252 Old myocardial infarction: Secondary | ICD-10-CM | POA: Insufficient documentation

## 2018-10-07 DIAGNOSIS — N138 Other obstructive and reflux uropathy: Secondary | ICD-10-CM | POA: Diagnosis not present

## 2018-10-07 DIAGNOSIS — I10 Essential (primary) hypertension: Secondary | ICD-10-CM | POA: Insufficient documentation

## 2018-10-07 DIAGNOSIS — N320 Bladder-neck obstruction: Secondary | ICD-10-CM | POA: Diagnosis not present

## 2018-10-07 DIAGNOSIS — Z882 Allergy status to sulfonamides status: Secondary | ICD-10-CM | POA: Diagnosis not present

## 2018-10-07 DIAGNOSIS — K219 Gastro-esophageal reflux disease without esophagitis: Secondary | ICD-10-CM | POA: Diagnosis not present

## 2018-10-07 DIAGNOSIS — Z951 Presence of aortocoronary bypass graft: Secondary | ICD-10-CM | POA: Insufficient documentation

## 2018-10-07 DIAGNOSIS — Z87891 Personal history of nicotine dependence: Secondary | ICD-10-CM | POA: Diagnosis not present

## 2018-10-07 DIAGNOSIS — I255 Ischemic cardiomyopathy: Secondary | ICD-10-CM | POA: Insufficient documentation

## 2018-10-07 DIAGNOSIS — E782 Mixed hyperlipidemia: Secondary | ICD-10-CM | POA: Insufficient documentation

## 2018-10-07 DIAGNOSIS — N401 Enlarged prostate with lower urinary tract symptoms: Secondary | ICD-10-CM

## 2018-10-07 HISTORY — PX: CYSTOSCOPY WITH INSERTION OF UROLIFT: SHX6678

## 2018-10-07 SURGERY — CYSTOSCOPY WITH INSERTION OF UROLIFT
Anesthesia: General | Site: Bladder

## 2018-10-07 MED ORDER — HYDROCODONE-ACETAMINOPHEN 7.5-325 MG PO TABS
1.0000 | ORAL_TABLET | Freq: Once | ORAL | Status: AC | PRN
  Administered 2018-10-07: 1 via ORAL

## 2018-10-07 MED ORDER — STERILE WATER FOR IRRIGATION IR SOLN
Status: DC | PRN
  Administered 2018-10-07: 500 mL

## 2018-10-07 MED ORDER — CEFAZOLIN SODIUM-DEXTROSE 2-4 GM/100ML-% IV SOLN
2.0000 g | INTRAVENOUS | Status: AC
  Administered 2018-10-07: 2 g via INTRAVENOUS

## 2018-10-07 MED ORDER — PROMETHAZINE HCL 25 MG/ML IJ SOLN: 6.2500 mg | INTRAMUSCULAR | Status: DC | PRN

## 2018-10-07 MED ORDER — STERILE WATER FOR IRRIGATION IR SOLN
Status: DC | PRN
  Administered 2018-10-07: 3000 mL

## 2018-10-07 MED ORDER — HYDROCODONE-ACETAMINOPHEN 7.5-325 MG PO TABS
ORAL_TABLET | ORAL | Status: AC
  Filled 2018-10-07: qty 1

## 2018-10-07 MED ORDER — LACTATED RINGERS IV SOLN
INTRAVENOUS | Status: DC
  Administered 2018-10-07: 10:00:00 via INTRAVENOUS

## 2018-10-07 MED ORDER — FENTANYL CITRATE (PF) 100 MCG/2ML IJ SOLN
INTRAMUSCULAR | Status: DC | PRN
  Administered 2018-10-07 (×2): 50 ug via INTRAVENOUS

## 2018-10-07 MED ORDER — MIDAZOLAM HCL 2 MG/2ML IJ SOLN: 0.5000 mg | Freq: Once | INTRAMUSCULAR | Status: DC | PRN

## 2018-10-07 MED ORDER — LIDOCAINE HCL URETHRAL/MUCOSAL 2 % EX GEL
CUTANEOUS | Status: AC
  Filled 2018-10-07: qty 10

## 2018-10-07 MED ORDER — HYDROCODONE-ACETAMINOPHEN 5-325 MG PO TABS
ORAL_TABLET | ORAL | Status: AC
  Filled 2018-10-07: qty 1

## 2018-10-07 MED ORDER — PROPOFOL 10 MG/ML IV BOLUS
INTRAVENOUS | Status: AC
  Filled 2018-10-07: qty 20

## 2018-10-07 MED ORDER — ONDANSETRON HCL 4 MG/2ML IJ SOLN
INTRAMUSCULAR | Status: AC
  Filled 2018-10-07: qty 2

## 2018-10-07 MED ORDER — FENTANYL CITRATE (PF) 100 MCG/2ML IJ SOLN
INTRAMUSCULAR | Status: AC
  Filled 2018-10-07: qty 2

## 2018-10-07 MED ORDER — HYDROMORPHONE HCL 1 MG/ML IJ SOLN: 0.2500 mg | INTRAMUSCULAR | Status: DC | PRN

## 2018-10-07 MED ORDER — PROPOFOL 500 MG/50ML IV EMUL
INTRAVENOUS | Status: DC | PRN
  Administered 2018-10-07: 120 ug/kg/min via INTRAVENOUS

## 2018-10-07 MED ORDER — LIDOCAINE HCL URETHRAL/MUCOSAL 2 % EX GEL
CUTANEOUS | Status: DC | PRN
  Administered 2018-10-07: 1 via URETHRAL

## 2018-10-07 MED ORDER — TRAMADOL HCL 50 MG PO TABS: 50.0000 mg | ORAL_TABLET | Freq: Four times a day (QID) | ORAL | 0 refills | Status: AC | PRN

## 2018-10-07 MED ORDER — ONDANSETRON HCL 4 MG/2ML IJ SOLN
INTRAMUSCULAR | Status: DC | PRN
  Administered 2018-10-07: 4 mg via INTRAVENOUS

## 2018-10-07 SURGICAL SUPPLY — 19 items
BAG DRAIN URO TABLE W/ADPT NS (BAG) ×3 IMPLANT
BAG DRN 8 ADPR NS SKTRN CSTL (BAG) ×1
CLOTH BEACON ORANGE TIMEOUT ST (SAFETY) ×3 IMPLANT
GLOVE BIO SURGEON STRL SZ8 (GLOVE) ×3 IMPLANT
GLOVE BIOGEL PI IND STRL 7.0 (GLOVE) ×2 IMPLANT
GLOVE BIOGEL PI INDICATOR 7.0 (GLOVE) ×4
GLOVE ECLIPSE 6.5 STRL STRAW (GLOVE) ×3 IMPLANT
GOWN STRL REUS W/TWL LRG LVL3 (GOWN DISPOSABLE) ×3 IMPLANT
GOWN STRL REUS W/TWL XL LVL3 (GOWN DISPOSABLE) ×3 IMPLANT
KIT TURNOVER CYSTO (KITS) ×3 IMPLANT
MANIFOLD NEPTUNE II (INSTRUMENTS) ×3 IMPLANT
PACK CYSTO (CUSTOM PROCEDURE TRAY) ×3 IMPLANT
PAD ARMBOARD 7.5X6 YLW CONV (MISCELLANEOUS) ×3 IMPLANT
SYSTEM UROLIFT (Male Continence) ×15 IMPLANT
TOWEL OR 17X26 4PK STRL BLUE (TOWEL DISPOSABLE) ×3 IMPLANT
TRAY FOLEY W/BAG SLVR 16FR (SET/KITS/TRAYS/PACK) ×2
TRAY FOLEY W/BAG SLVR 16FR ST (SET/KITS/TRAYS/PACK) ×1 IMPLANT
WATER STERILE IRR 3000ML UROMA (IV SOLUTION) ×3 IMPLANT
WATER STERILE IRR 500ML POUR (IV SOLUTION) ×3 IMPLANT

## 2018-10-07 NOTE — Progress Notes (Signed)
Moderate amt of red drainage from meatus from around foley. Area cleansed. Gauze applied to penis. Dr Alyson Ingles notified. He is expecting some drainage. No new orders given.

## 2018-10-07 NOTE — Anesthesia Postprocedure Evaluation (Signed)
Anesthesia Post Note  Patient: Harold English  Procedure(s) Performed: CYSTOSCOPY WITH INSERTION OF UROLIFT (N/A Bladder)  Patient location during evaluation: PACU Anesthesia Type: MAC Level of consciousness: awake and alert Pain management: pain level controlled Vital Signs Assessment: post-procedure vital signs reviewed and stable Respiratory status: spontaneous breathing and respiratory function stable Cardiovascular status: stable Postop Assessment: no apparent nausea or vomiting Anesthetic complications: no     Last Vitals:  Vitals:   10/07/18 0821 10/07/18 0856  BP:  120/78  Pulse:  83  Temp: 36.6 C     Last Pain:  Vitals:   10/07/18 0856  TempSrc: Oral                 Tilman Mcclaren

## 2018-10-07 NOTE — H&P (Signed)
Urology Admission H&P  Chief Complaint: incomplete emptying  History of Present Illness: Mr Harold English is a 77yo with a hx of BPH and incomplete emptying who presents today for urolift. He has severe LUTS. No hematuria or dysuria. Pt is on eliquis  Past Medical History:  Diagnosis Date  . Atypical chest pain 09/16/2017  . CAD (coronary artery disease)   . Coronary artery disease involving coronary bypass graft of native heart with angina pectoris (HCC) 11/17/2009   Qualifier: Diagnosis of  By: Diona BrownerMcDowell, MD, Hollace HaywardFACC, Samuel Grainger   . Essential hypertension, benign 11/17/2009   Qualifier: Diagnosis of  By: Diona BrownerMcDowell, MD, Hollace HaywardFACC, Samuel Grainger   . GERD (gastroesophageal reflux disease)   . Hyperlipidemia   . Hypertension   . Ischemic cardiomyopathy   . MI (myocardial infarction) (HCC)   . Mixed hyperlipidemia 11/17/2009   Qualifier: Diagnosis of  By: Diona BrownerMcDowell, MD, Hollace HaywardFACC, Samuel Grainger   . Renal insufficiency   . Typical atrial flutter (HCC) 09/21/2017  . UNSPECIFIED SECONDARY CARDIOMYOPATHY 11/17/2009   Qualifier: Diagnosis of  By: Diona BrownerMcDowell, MD, Hollace HaywardFACC, Samuel Grainger   . Ventricular tachyarrhythmia (HCC) 09/16/2017  . Vertigo 09/21/2017   Past Surgical History:  Procedure Laterality Date  . ADENOIDECTOMY    . BACK SURGERY    . CARDIAC SURGERY    . CORONARY ARTERY BYPASS GRAFT    . CORONARY STENT INTERVENTION N/A 09/18/2017   Procedure: CORONARY STENT INTERVENTION;  Surgeon: Tonny Bollmanooper, Michael, MD;  [facility];  Service: Cardiovascular;  Laterality: N/A;  . LEFT HEART CATH AND CORS/GRAFTS ANGIOGRAPHY N/A 09/18/2017   Procedure: LEFT HEART CATH AND CORS/GRAFTS ANGIOGRAPHY;  Surgeon: Tonny Bollmanooper, Michael, MD;  [facility];  Service: Cardiovascular;  Laterality: N/A;  . TONSILLECTOMY      Home Medications:  No current facility-administered medications for this encounter.    Allergies:  Allergies  Allergen Reactions  . Sulfonamide Derivatives     Childhood      Family History  Problem Relation Age of Onset  . CAD Father    Social History:  reports that he quit smoking about 12 months ago. His smoking use included cigarettes. He smoked 0.75 packs per day for 0.00 years. He has never used smokeless tobacco. He reports current alcohol use. He reports that he does not use drugs.  Review of Systems  Genitourinary: Positive for frequency and urgency.  All other systems reviewed and are negative.   Physical Exam:  Vital signs in last 24 hours: Temp:  [97.9 F (36.6 C)] 97.9 F (36.6 C) (06/15 0821) Pulse Rate:  [83] 83 (06/15 0856) BP: (120)/(78) 120/78 (06/15 0856) Weight:  [90.7 kg] 90.7 kg (06/15 0856) Physical Exam  Constitutional: He is oriented to person, place, and time. He appears well-developed and well-nourished.  HENT:  Head: Normocephalic and atraumatic.  Eyes: Pupils are equal, round, and reactive to light. EOM are normal.  Neck: Normal range of motion. No thyromegaly present.  Cardiovascular: Normal rate and regular rhythm.  Respiratory: Effort normal. No respiratory distress.  GI: Soft. He exhibits no distension.  Musculoskeletal: Normal range of motion.        General: No edema.  Neurological: He is alert and oriented to person, place, and time.  Skin: Skin is warm and dry.  Psychiatric: He has a normal mood and affect. His behavior is normal. Judgment and thought content normal.    Laboratory Data:  No results found for this or any previous visit (from the past 24  hour(s)). Recent Results (from the past 240 hour(s))  Novel Coronavirus, NAA (hospital order; send-out to ref lab)     Status: None   Collection Time: 10/03/18  6:28 AM   Specimen: Nasopharyngeal Swab; Respiratory  Result Value Ref Range Status   SARS-CoV-2, NAA NOT DETECTED NOT DETECTED Final    Comment: (NOTE) This test was developed and its performance characteristics determined by [employer]. This test has not been FDA cleared or  approved. This test has been authorized by FDA under an Emergency Use Authorization (EUA). This test is only authorized for the duration of time the declaration that circumstances exist justifying the authorization of the emergency use of in vitro diagnostic tests for detection of SARS-CoV-2 virus and/or diagnosis of COVID-19 infection under section 564(b)(1) of the Act, 21 U.S.C. 782NFA-2(Z)(3), unless the authorization is terminated or revoked sooner. When diagnostic testing is negative, the possibility of a false negative result should be considered in the context of a patient's recent exposures and the presence of clinical signs and symptoms consistent with COVID-19. An individual without symptoms of COVID-19 and who is not shedding SARS-CoV-2 virus would expect to have a negative (not detected) result in this assay. Performed  At: [facility] [place], [place] [id number] Rush Farmer MD GE:[id number]    [place]  Final    Comment: Performed at [facility], [address], [place], [place] [address]   Creatinine: No results for input(s): CREATININE in the last 168 hours. Baseline Creatinine: unknown  Impression/Assessment:  77yo with BPH with incomplete emptying  Plan:  The risks/benefits/alternatives to Urolift was explained to the patient and he understands and wishes to proceed with surgery  Nicolette Bang 10/07/2018, 9:01 AM

## 2018-10-07 NOTE — Transfer of Care (Signed)
Immediate Anesthesia Transfer of Care Note  Patient: Harold English  Procedure(s) Performed: CYSTOSCOPY WITH INSERTION OF UROLIFT (N/A Bladder)  Patient Location: PACU  Anesthesia Type:MAC  Level of Consciousness: awake, alert  and oriented  Airway & Oxygen Therapy: Patient Spontanous Breathing and Patient connected to nasal cannula oxygen  Post-op Assessment: Report given to RN and Post -op Vital signs reviewed and stable  Post vital signs: Reviewed and stable  Last Vitals:  Vitals Value Taken Time  BP    Temp    Pulse 118 10/07/18 1015  Resp    SpO2 95 % 10/07/18 1015  Vitals shown include unvalidated device data.  Last Pain:  Vitals:   10/07/18 0856  TempSrc: Oral      Patients Stated Pain Goal: 0 (54/27/06 2376)  Complications: No apparent anesthesia complications

## 2018-10-07 NOTE — Discharge Instructions (Signed)

## 2018-10-07 NOTE — Anesthesia Preprocedure Evaluation (Signed)
Anesthesia Evaluation  Patient identified by MRN, date of birth, ID band Patient awake    Reviewed: Allergy & Precautions, NPO status , Patient's Chart, lab work & pertinent test results, reviewed documented beta blocker date and time   Airway Mallampati: II  TM Distance: >3 FB Neck ROM: Full    Dental no notable dental hx. (+) Teeth Intact   Pulmonary pneumonia, resolved, former smoker,  Reports pneuminia x2 04/2018 Quit smoking Denies inhaler use /o2 use -RA sats Good today    Pulmonary exam normal breath sounds clear to auscultation       Cardiovascular Exercise Tolerance: Good hypertension, Pt. on medications and Pt. on home beta blockers + angina with exertion + CAD, + Past MI and + CABG  Normal cardiovascular exam+ dysrhythmias Atrial Fibrillation I Rhythm:Regular Rate:Normal  Reports MI x2 2018 States in Afib/flutter  reports good ET States CABG in 2010  Plans on seeing Cardiology  States can walk 4 blocks on level ground     Neuro/Psych negative neurological ROS  negative psych ROS   GI/Hepatic Neg liver ROS, GERD  Medicated and Controlled,  Endo/Other  negative endocrine ROS  Renal/GU Renal InsufficiencyRenal diseaseHere for urolift  negative genitourinary   Musculoskeletal negative musculoskeletal ROS (+)   Abdominal   Peds negative pediatric ROS (+)  Hematology negative hematology ROS (+)   Anesthesia Other Findings   Reproductive/Obstetrics negative OB ROS                             Anesthesia Physical Anesthesia Plan  ASA: IV  Anesthesia Plan: General   Post-op Pain Management:    Induction: Intravenous  PONV Risk Score and Plan:   Airway Management Planned: Mask and Oral ETT  Additional Equipment:   Intra-op Plan:   Post-operative Plan: Extubation in OR  Informed Consent: I have reviewed the patients History and Physical, chart, labs and discussed the  procedure including the risks, benefits and alternatives for the proposed anesthesia with the patient or authorized representative who has indicated his/her understanding and acceptance.     Dental advisory given  Plan Discussed with: CRNA  Anesthesia Plan Comments: (Plan Full PPE use  Plan GA with GETA back up as needed )        Anesthesia Quick Evaluation

## 2018-10-07 NOTE — Op Note (Signed)
   PREOPERATIVE DIAGNOSIS: Benign prostatic hypertrophy with bladder outlet obstruction.  POSTOPERATIVE DIAGNOSIS: Benign prostatic hypertrophy with bladder outlet obstruction.  PROCEDURE: Cystoscopy with implantation of UroLift devices, 4 implants.  SURGEON: Nicolette Bang, M.D.  ANESTHESIA: MAC  ANTIBIOTICS: Ancef  SPECIMEN: None.  DRAINS: A 16-French Foley catheter.  BLOOD LOSS: Minimal.  COMPLICATIONS: None.  Findings: Bilobar hyperplasia. 1 implant failed to deploy and a total of 5 implants were used.  INDICATIONS:The Patient is an 78 year old male with BPH and bladder outlet obstruction. He has failed medical therapy and has elected UroLift for definitive treatment.  FINDINGS OF PROCEDURE: He was taken to the operating room where a genral anesthetic was induced. He was placed in lithotomy position and was fitted with PAS hose. His perineum and genitalia were prepped with chlorhexidine, and he was draped in usual sterile fashion.  Cystoscopy was performed using the UroLift scope and 0 degree lens. Examination revealed a normal urethra. The external sphincter was intact. Prostatic urethra was approximately 4 cm in length with lateral lobe enlargement. There was also little bit of bladder neck elevation. Inspection of bladder revealed mild-to-moderate trabeculation with no tumors, stones, or inflammation. No cellules or diverticula were noted. Ureteral orifices were in their normal anatomic position effluxing clear urine.  After initial cystoscopy, the visual obturator was replaced with the first UroLift device. This was turned to the 9 o'clock position and pulled back to the veru and then slightly advanced. Pressure was then applied to the right lateral lobe and the UroLift device was deployed.  The second UroLift device was then inserted and applied to the left lateral lobe at 3 o'clock and deployed in the mid prostatic urethra. After  this, there was still some apparent obstruction closer to the bladder neck. So a second level of UroLift your left device was applied between the mid urethra and the proximal urethra providing further patency to the prostatic urethra. At this point, there was mild bleeding but the patient did have a spinal anesthetic. So it was thought that a Foley catheter was indicated. The scope was removed and a 16-French Foley catheter was inserted without difficulty. The balloon was filled with 10 mL sterile fluid, and the catheter was placed to straight drainage.  COMPLICATIONS: None   CONDITION: Stable, extubated, transferred to PACU  PLAN: The patient will be discharged home and followup in 2 days for a voiding trial.

## 2018-10-08 ENCOUNTER — Encounter (HOSPITAL_COMMUNITY): Payer: Self-pay | Admitting: Urology

## 2018-10-15 ENCOUNTER — Ambulatory Visit (INDEPENDENT_AMBULATORY_CARE_PROVIDER_SITE_OTHER): Payer: Medicare Other | Admitting: Urology

## 2018-10-15 DIAGNOSIS — N401 Enlarged prostate with lower urinary tract symptoms: Secondary | ICD-10-CM | POA: Diagnosis not present

## 2018-10-15 DIAGNOSIS — R3914 Feeling of incomplete bladder emptying: Secondary | ICD-10-CM

## 2018-11-19 ENCOUNTER — Other Ambulatory Visit: Payer: Self-pay | Admitting: Physician Assistant

## 2018-11-19 NOTE — Telephone Encounter (Signed)
Please review for refill. Thanks!  

## 2018-11-19 NOTE — Telephone Encounter (Signed)
77 M 97.4 kg SCr 1.48 (12/2017), LOV Cecilie Kicks 12/2017

## 2018-11-27 ENCOUNTER — Ambulatory Visit (INDEPENDENT_AMBULATORY_CARE_PROVIDER_SITE_OTHER): Payer: Medicare Other | Admitting: Urology

## 2018-11-27 DIAGNOSIS — N401 Enlarged prostate with lower urinary tract symptoms: Secondary | ICD-10-CM

## 2018-11-27 DIAGNOSIS — N5201 Erectile dysfunction due to arterial insufficiency: Secondary | ICD-10-CM | POA: Diagnosis not present

## 2018-12-20 ENCOUNTER — Other Ambulatory Visit: Payer: Self-pay | Admitting: Physician Assistant

## 2019-01-09 IMAGING — US US RENAL
1 series · 14 of 25 positions shown · non-contrast
Comparison: None.

CLINICAL DATA: Chronic renal disease

EXAM:
RENAL / URINARY TRACT ULTRASOUND COMPLETE

[Series 1: us renal · 0.28mm/px · 14 of 42 slices shown]
[im 1/42]
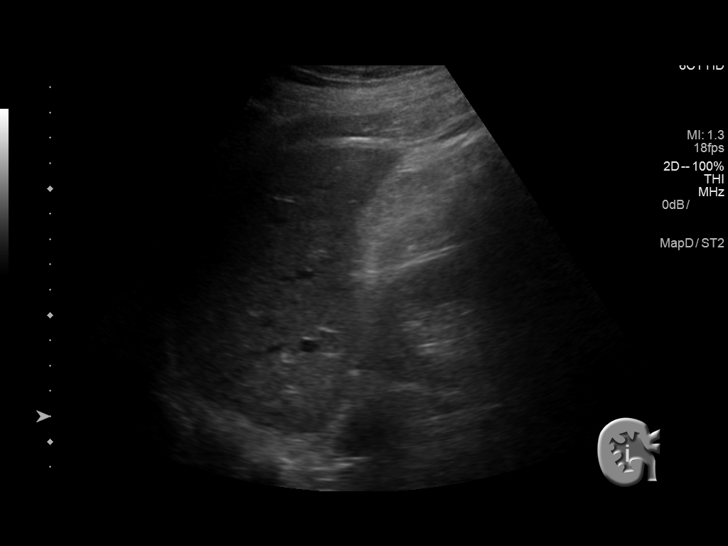
[im 4/42]
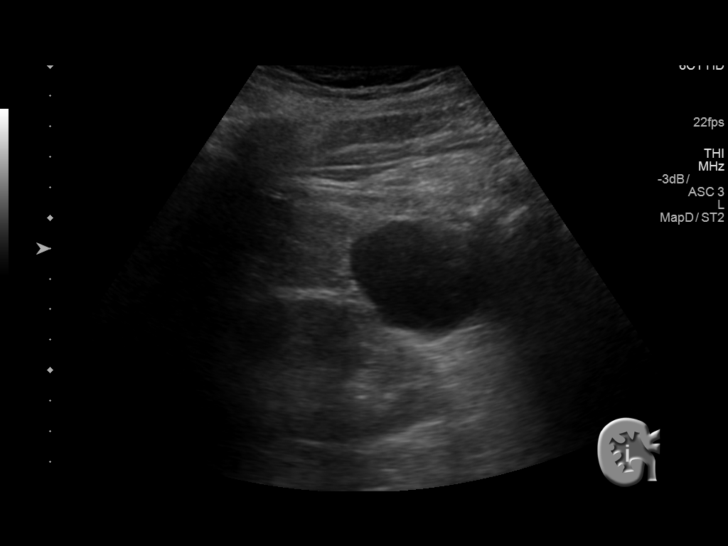
[im 7/42]
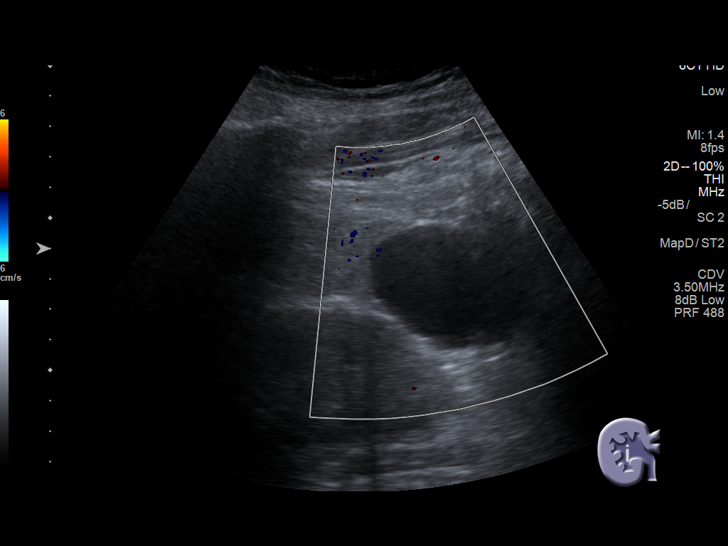
[im 11/42]
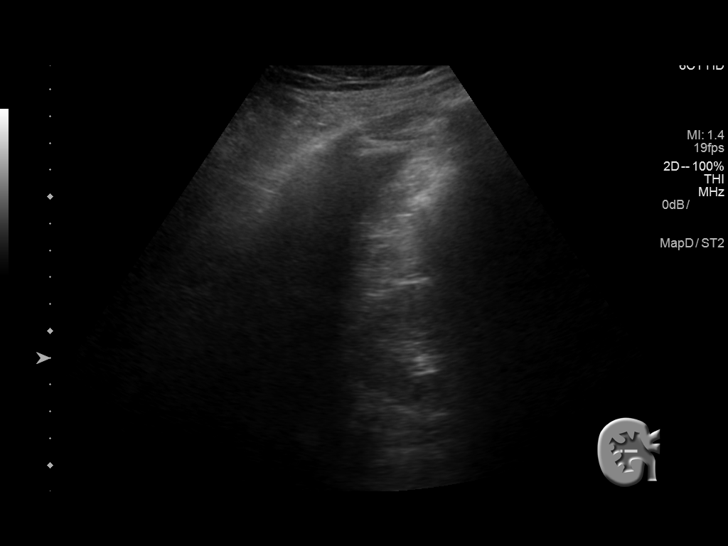
[im 14/42]
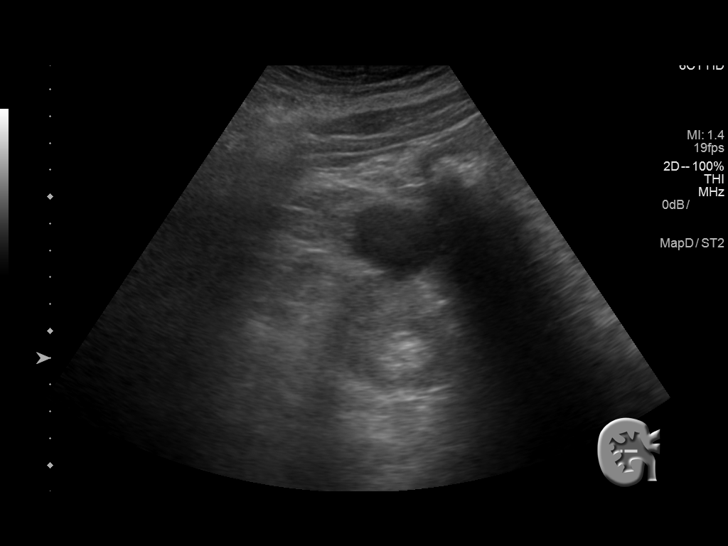
[im 16/42]
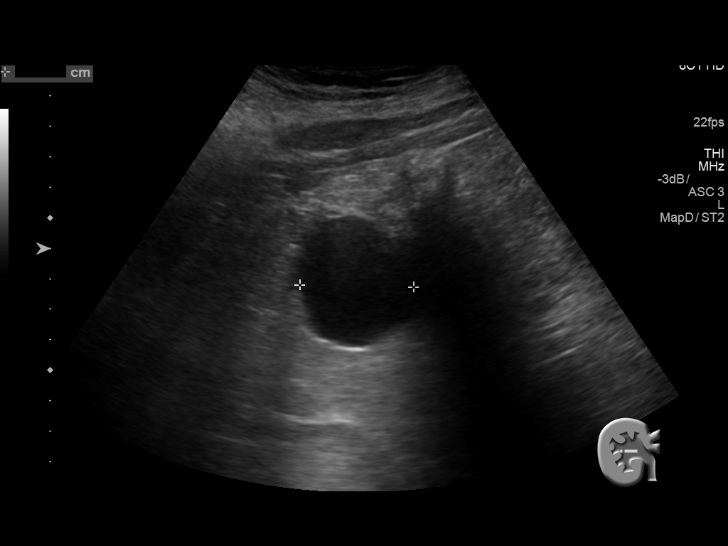
[im 19/42]
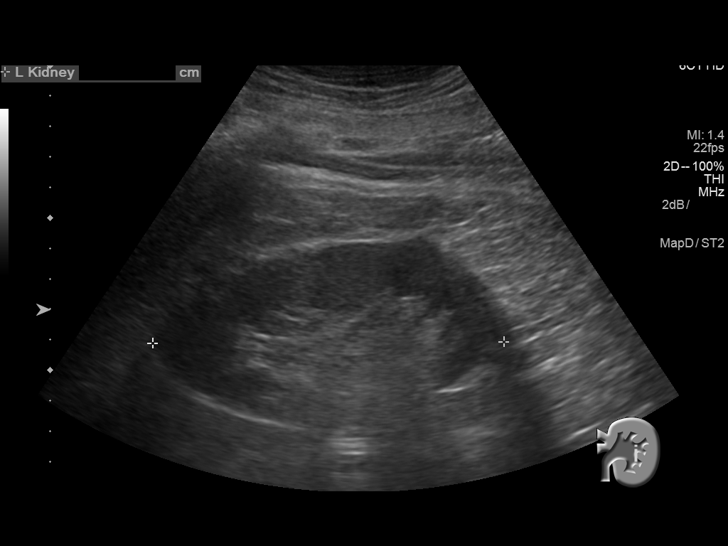
[im 23/42]
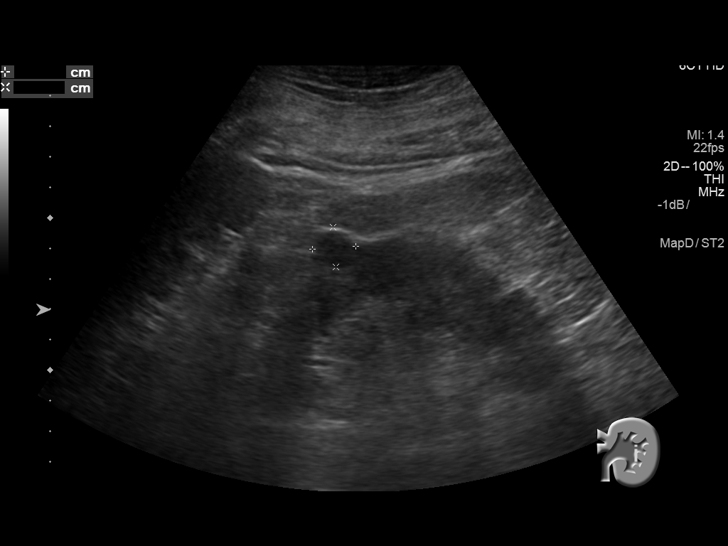
[im 26/42]
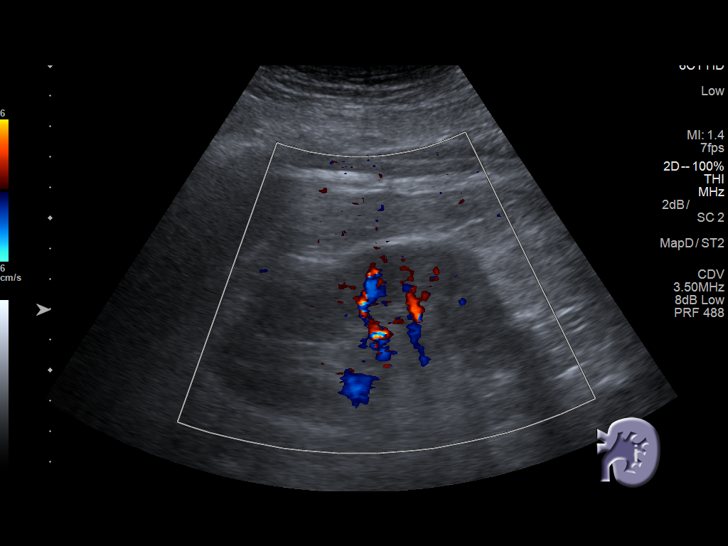
[im 28/42]
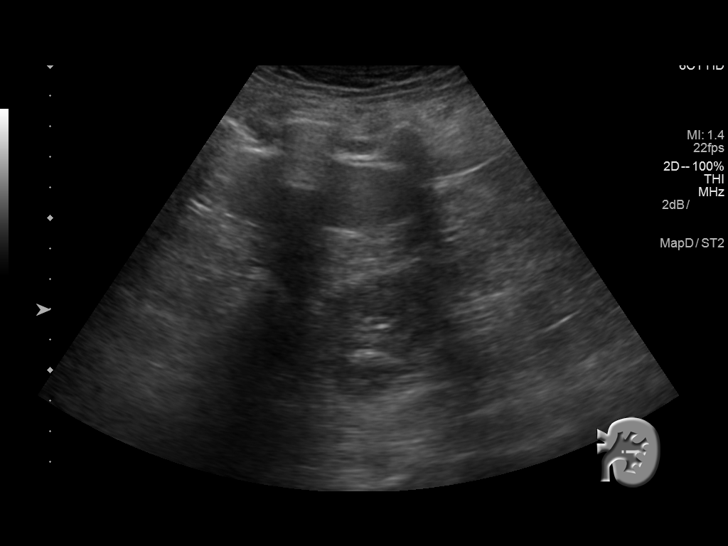
[im 31/42]
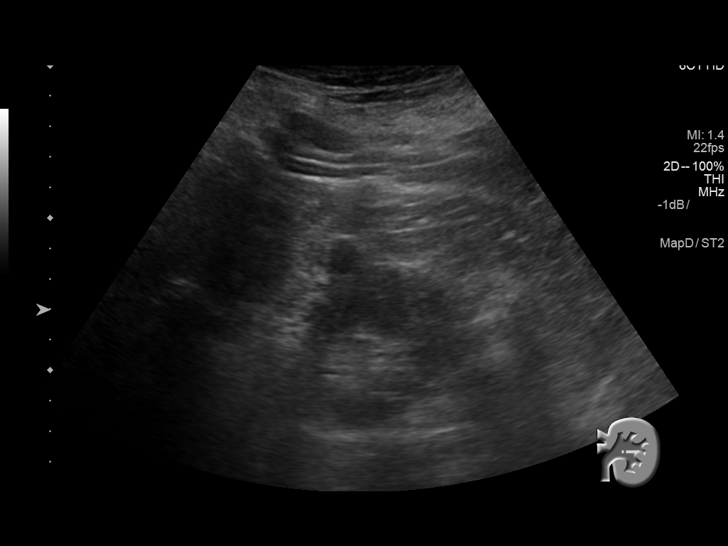
[im 35/42]
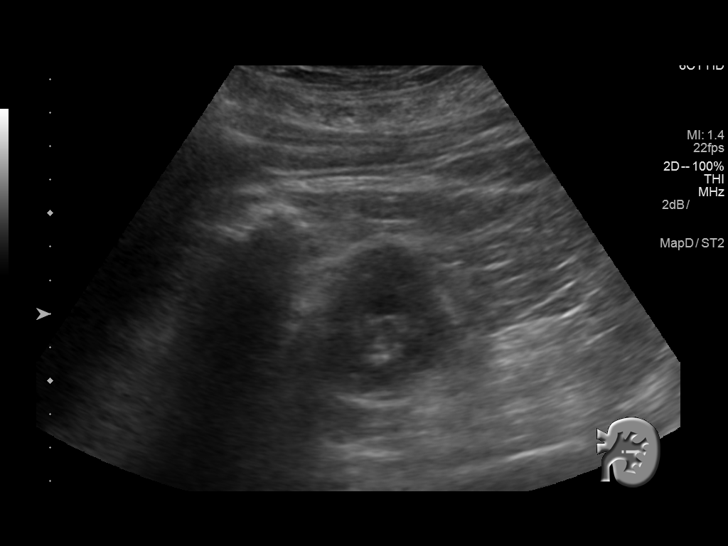
[im 38/42]
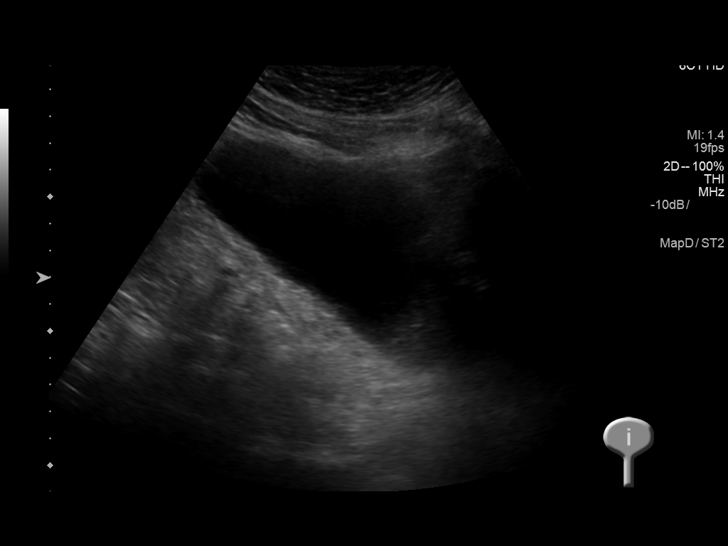
[im 42/42]
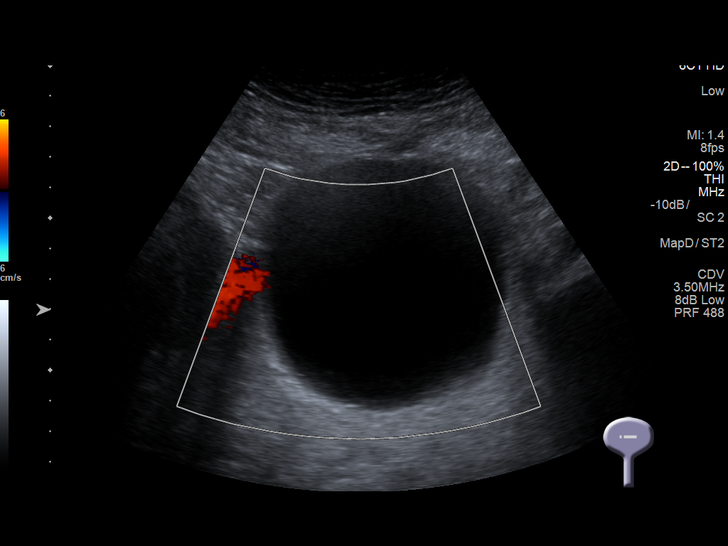

[14 of 25 positions shown; findings below may reference images not displayed]

FINDINGS: Right Kidney:

Length: 10.3 cm. Echogenicity and renal cortical thickness are
within normal limits. No perinephric fluid or hydronephrosis
visualized. There is a cyst arising eccentrically from the lower
pole left kidney measuring 4.8 x 4.4 x 3.7 cm. No sonographically
demonstrable calculus or ureterectasis.

Left Kidney:

Length: 11.5 cm. Echogenicity and renal cortical thickness are
within normal limits. No perinephric fluid or hydronephrosis
visualized. There is a cyst arising from upper to mid left kidney
measuring 1.4 x 1.3 x 1.3 cm. No sonographically demonstrable
calculus or ureterectasis.

Bladder:

Appears normal for degree of bladder distention.
IMPRESSION: Cyst in each kidney, considerably larger on the right than on the
left. Study otherwise unremarkable.

## 2019-01-10 ENCOUNTER — Other Ambulatory Visit: Payer: Self-pay | Admitting: Physician Assistant

## 2019-01-15 ENCOUNTER — Other Ambulatory Visit: Payer: Self-pay

## 2019-01-15 MED ORDER — DIGOXIN 125 MCG PO TABS: ORAL_TABLET | ORAL | 0 refills | Status: AC

## 2019-02-17 ENCOUNTER — Other Ambulatory Visit: Payer: Self-pay

## 2019-02-26 ENCOUNTER — Ambulatory Visit (INDEPENDENT_AMBULATORY_CARE_PROVIDER_SITE_OTHER): Payer: Medicare Other | Admitting: Urology

## 2019-02-26 DIAGNOSIS — N5201 Erectile dysfunction due to arterial insufficiency: Secondary | ICD-10-CM | POA: Diagnosis not present

## 2019-02-26 DIAGNOSIS — N401 Enlarged prostate with lower urinary tract symptoms: Secondary | ICD-10-CM

## 2019-05-02 DIAGNOSIS — Z95810 Presence of automatic (implantable) cardiac defibrillator: Secondary | ICD-10-CM | POA: Diagnosis not present

## 2019-05-02 DIAGNOSIS — I5042 Chronic combined systolic (congestive) and diastolic (congestive) heart failure: Secondary | ICD-10-CM | POA: Diagnosis not present

## 2019-05-02 DIAGNOSIS — I10 Essential (primary) hypertension: Secondary | ICD-10-CM | POA: Diagnosis not present

## 2019-05-02 DIAGNOSIS — Z882 Allergy status to sulfonamides status: Secondary | ICD-10-CM | POA: Diagnosis not present

## 2019-05-02 DIAGNOSIS — I428 Other cardiomyopathies: Secondary | ICD-10-CM | POA: Diagnosis not present

## 2019-05-02 DIAGNOSIS — I429 Cardiomyopathy, unspecified: Secondary | ICD-10-CM | POA: Diagnosis not present

## 2019-05-02 DIAGNOSIS — I255 Ischemic cardiomyopathy: Secondary | ICD-10-CM | POA: Diagnosis not present

## 2019-05-02 DIAGNOSIS — I252 Old myocardial infarction: Secondary | ICD-10-CM | POA: Diagnosis not present

## 2019-05-02 DIAGNOSIS — Z4502 Encounter for adjustment and management of automatic implantable cardiac defibrillator: Secondary | ICD-10-CM | POA: Diagnosis not present

## 2019-05-02 DIAGNOSIS — R0989 Other specified symptoms and signs involving the circulatory and respiratory systems: Secondary | ICD-10-CM | POA: Diagnosis not present

## 2019-05-02 DIAGNOSIS — Z87891 Personal history of nicotine dependence: Secondary | ICD-10-CM | POA: Diagnosis not present

## 2019-05-02 DIAGNOSIS — I517 Cardiomegaly: Secondary | ICD-10-CM | POA: Diagnosis not present

## 2019-05-02 DIAGNOSIS — I483 Typical atrial flutter: Secondary | ICD-10-CM | POA: Diagnosis not present

## 2019-05-02 DIAGNOSIS — I4892 Unspecified atrial flutter: Secondary | ICD-10-CM | POA: Diagnosis not present

## 2019-05-14 DIAGNOSIS — I4892 Unspecified atrial flutter: Secondary | ICD-10-CM | POA: Diagnosis not present

## 2019-05-14 DIAGNOSIS — I255 Ischemic cardiomyopathy: Secondary | ICD-10-CM | POA: Diagnosis not present

## 2019-05-14 DIAGNOSIS — Z95810 Presence of automatic (implantable) cardiac defibrillator: Secondary | ICD-10-CM | POA: Diagnosis not present

## 2019-06-09 IMAGING — CR DG CHEST 1V PORT
1 series · 2 of 2 positions shown · non-contrast
Comparison: September 06, 2013

CLINICAL DATA: Tachycardia and chest pain

EXAM:
PORTABLE CHEST 1 VIEW

[Series 1: portable · 0.17mm/px · 2 of 2 slices shown]
[im 1/2]
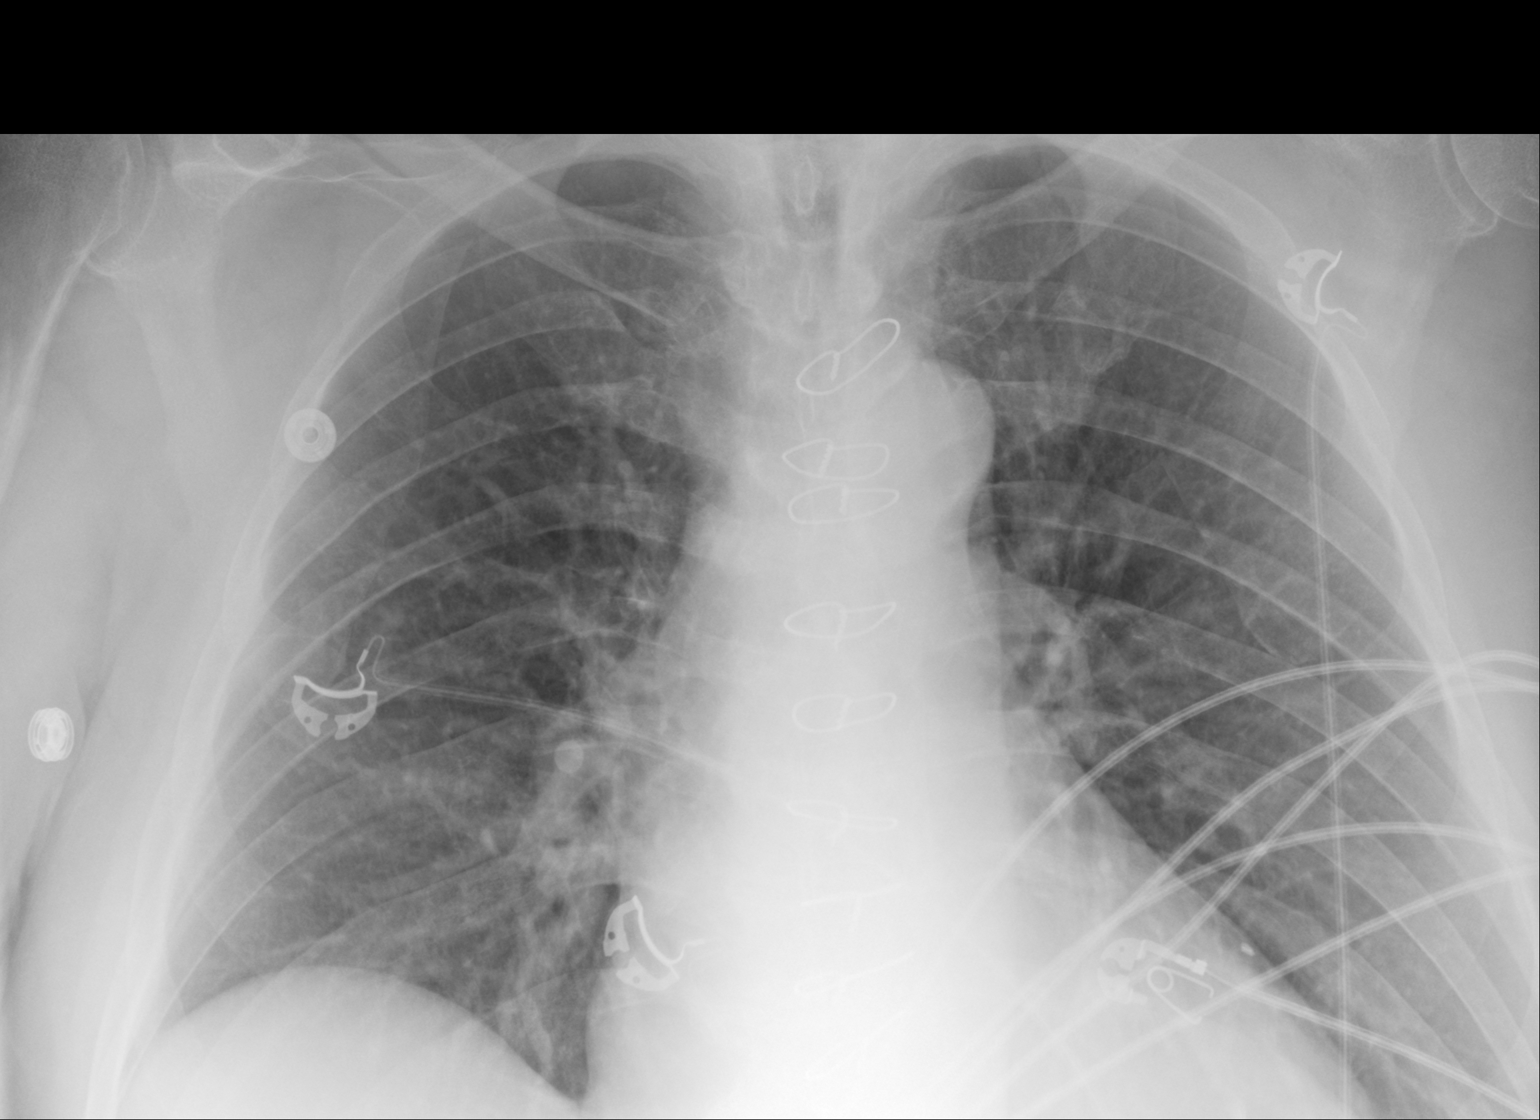
[im 2/2]
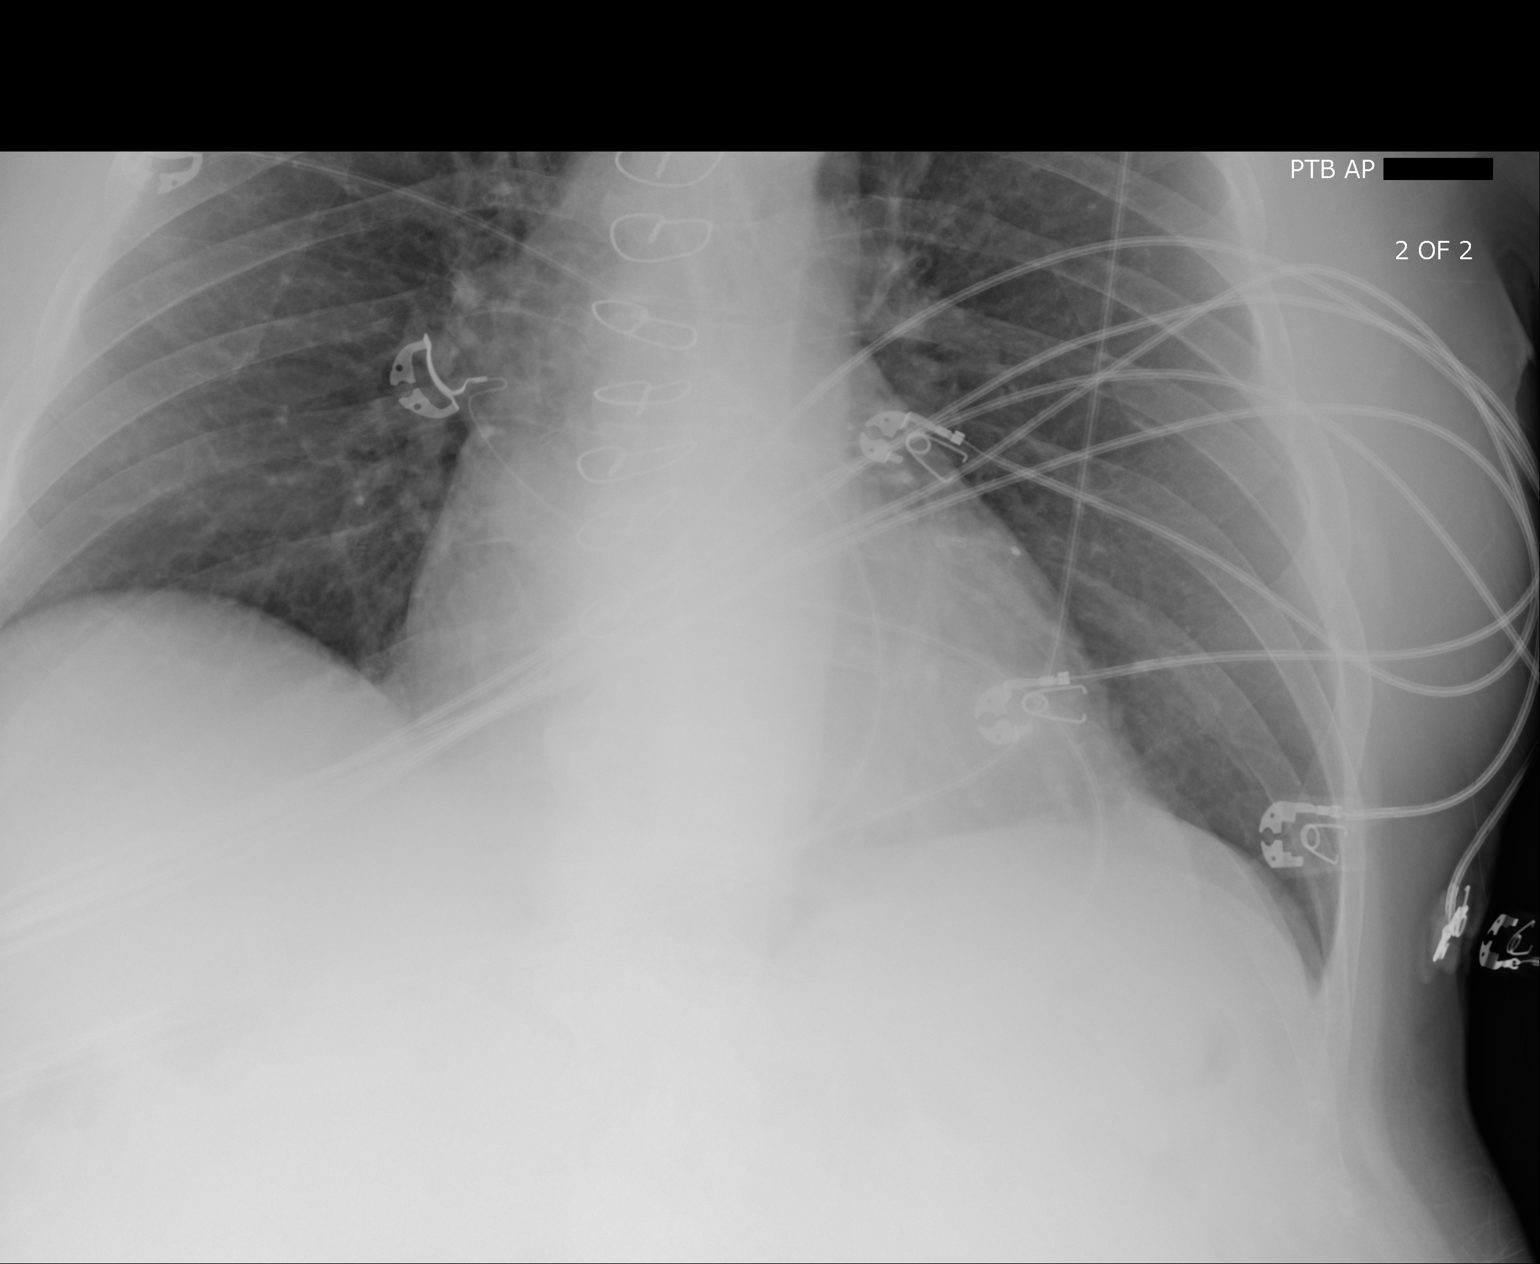

[2 of 2 positions shown; findings below may reference images not displayed]

FINDINGS: There is no edema or consolidation. Heart is upper normal in size
with pulmonary vascularity normal. Patient is status post coronary
artery bypass grafting. No pneumothorax. No adenopathy. No bone
lesions.
IMPRESSION: No edema or consolidation.  Heart upper normal in size.

## 2019-06-14 IMAGING — CT CT HEAD W/O CM
3 series · 16 of 47 positions shown, 19 images · non-contrast
Comparison: None.

CLINICAL DATA: Persistent central vertigo

EXAM:
CT HEAD WITHOUT CONTRAST
TECHNIQUE: Contiguous axial images were obtained from the base of the skull
through the vertex without intravenous contrast.

[Series 2: head wo · axial · 0.43mm/px · z∈[+38,+163]mm · 10 of 31 slices shown, 13 images]
[im 3/31  brain]
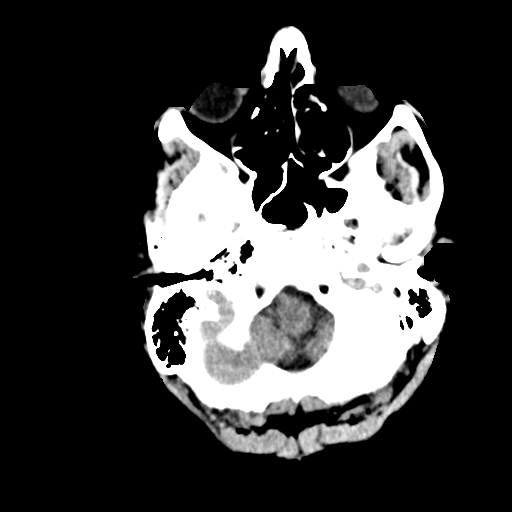
[im 3/31  bone]
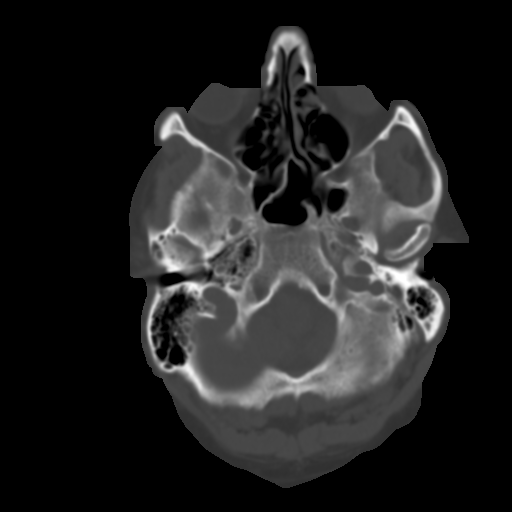
[im 6/31  brain]
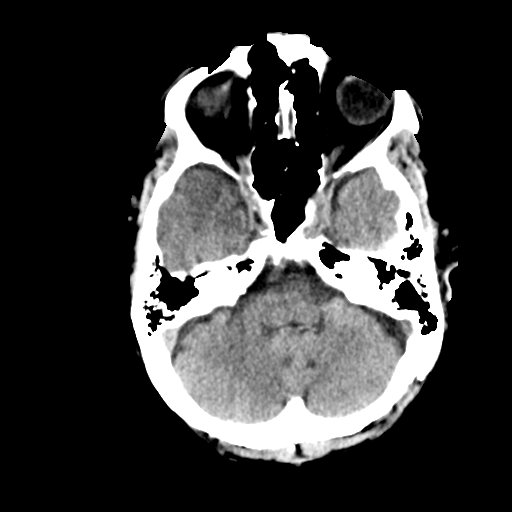
[im 9/31  brain]
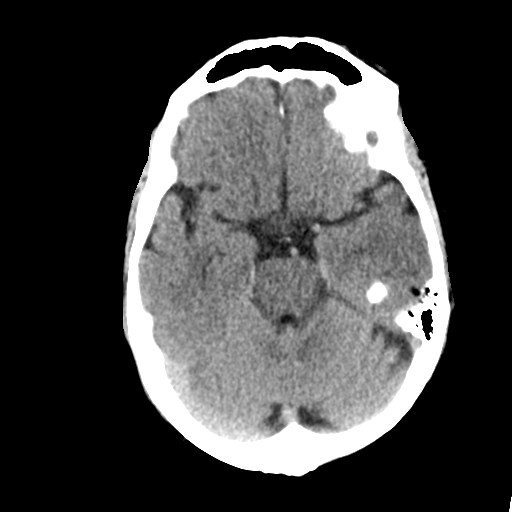
[im 11/31  brain]
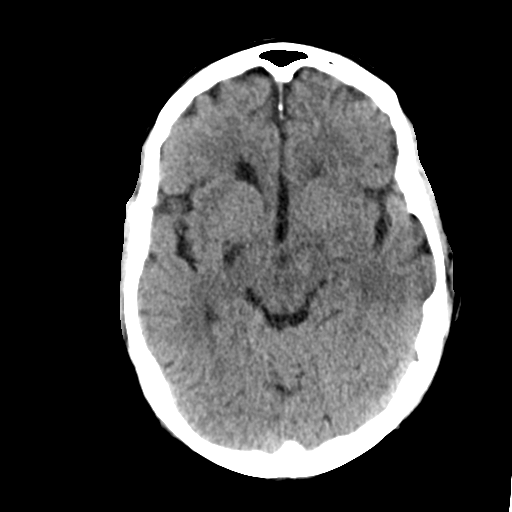
[im 14/31  brain]
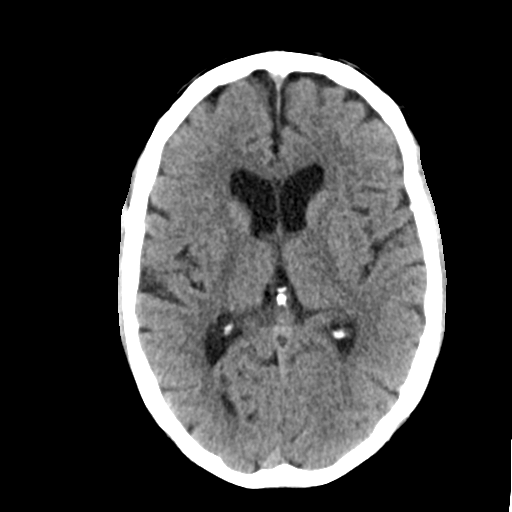
[im 14/31  bone]
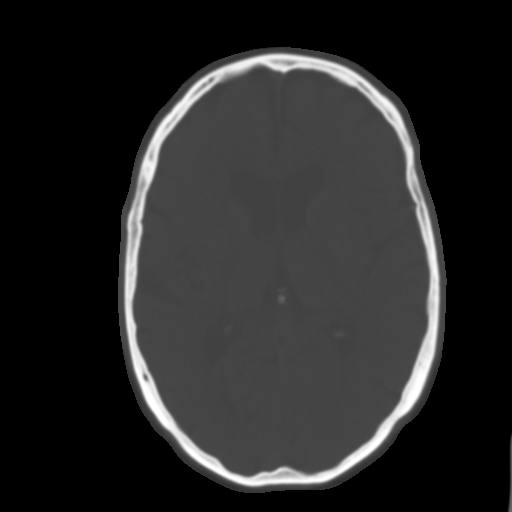
[im 17/31  brain]
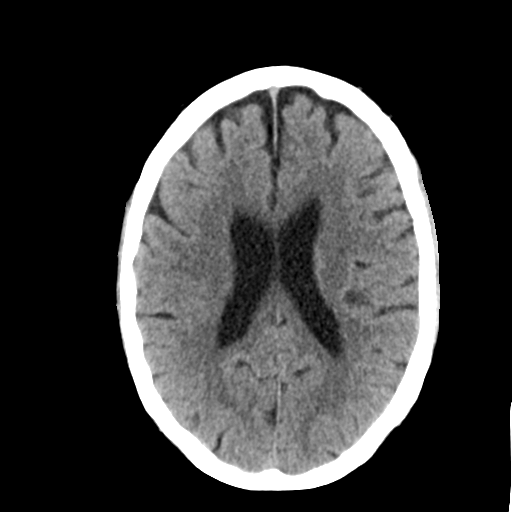
[im 20/31  brain]
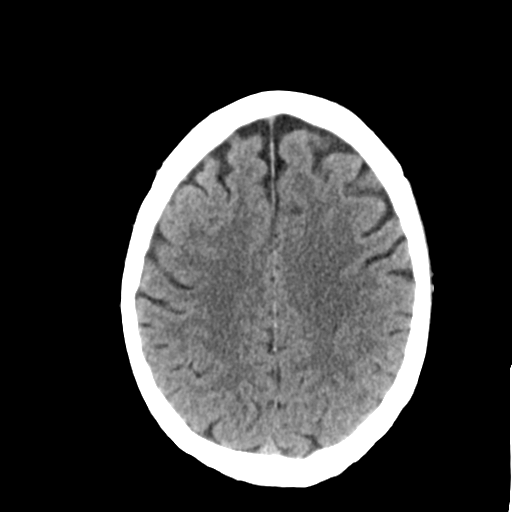
[im 23/31  brain]
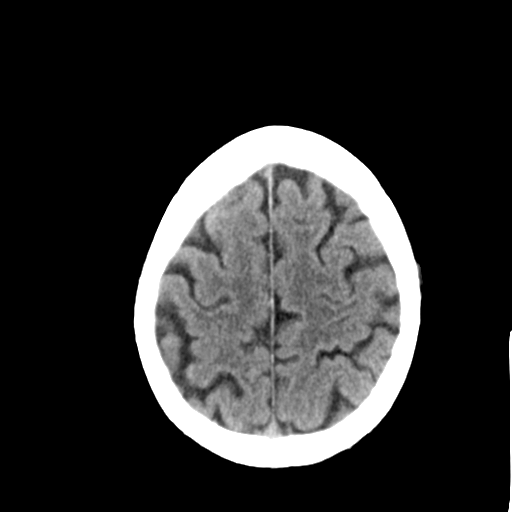
[im 25/31  brain]
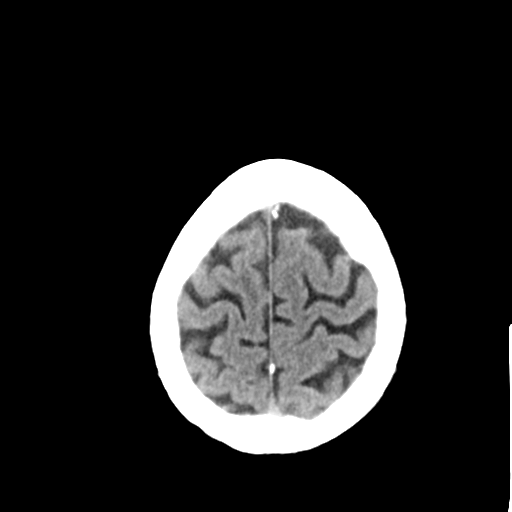
[im 25/31  bone]
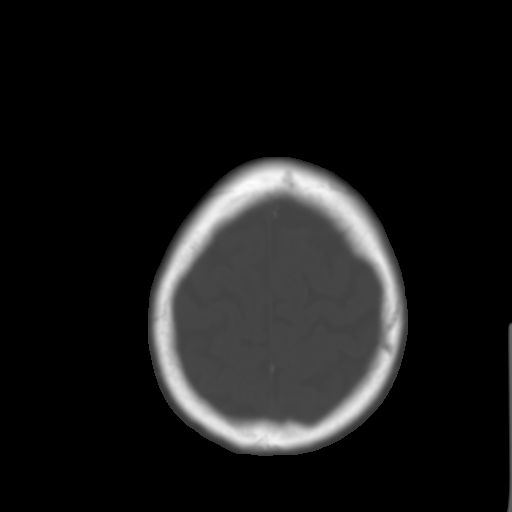
[im 28/31  brain]
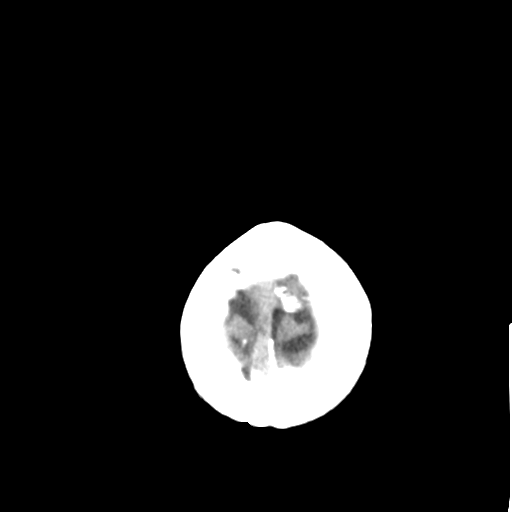

[Series 4: coronal soft tissue · coronal · 0.32mm/px · 3 of 68 slices shown]
[im 23/68  brain]
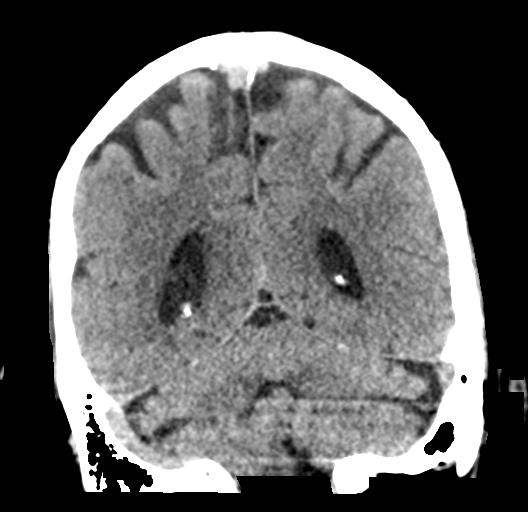
[im 30/68  brain]
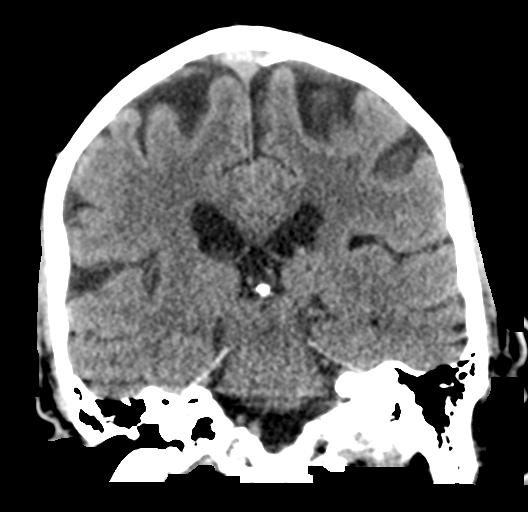
[im 38/68  brain]
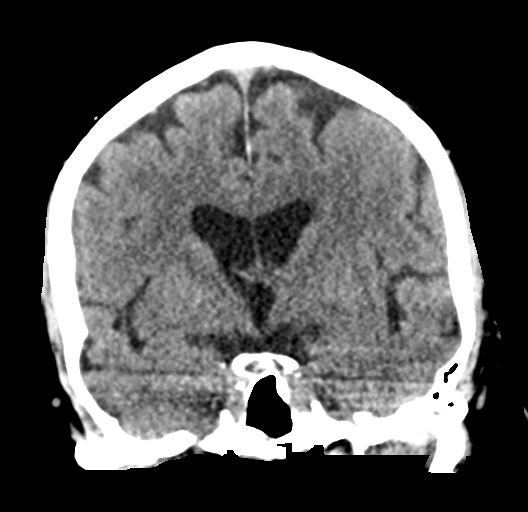

[Series 5: sagittal soft tissue · sagittal · 0.33mm/px · 3 of 54 slices shown]
[im 18/54  brain]
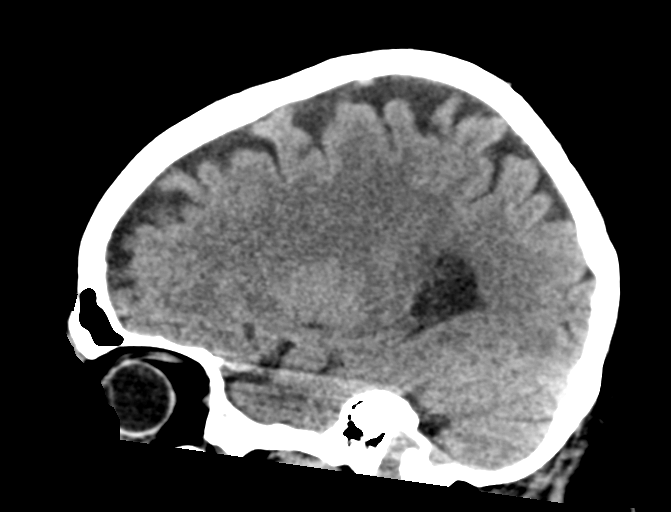
[im 27/54  brain]
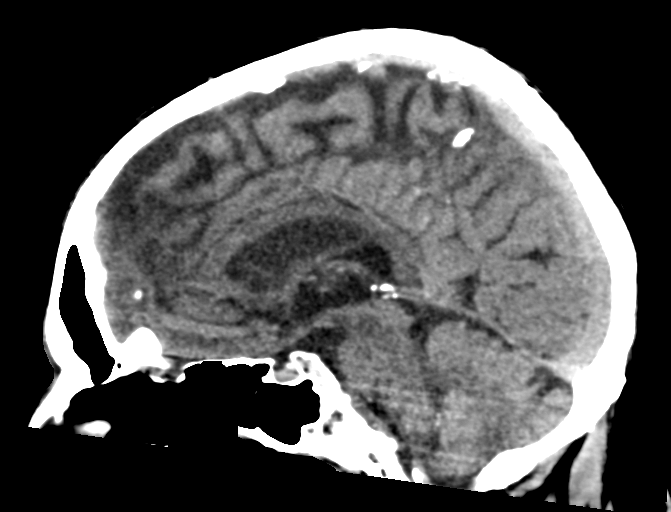
[im 36/54  brain]
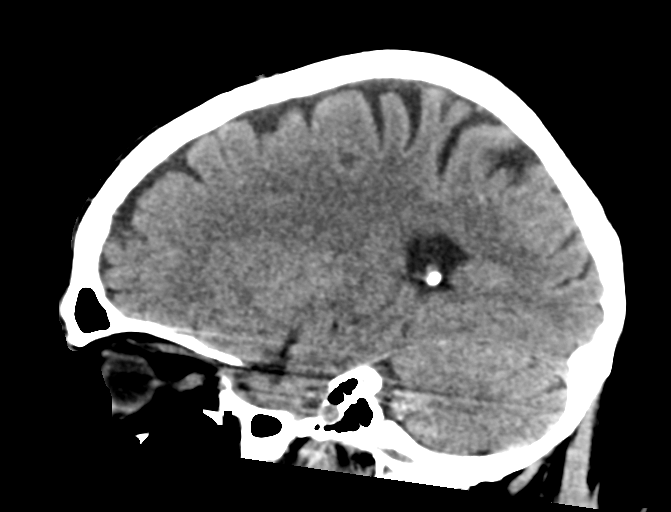

[16 of 47 positions shown; findings below may reference images not displayed]

FINDINGS: Brain: No evidence of acute infarction, hemorrhage, hydrocephalus,
extra-axial collection or mass lesion/mass effect. No finding in the
brainstem, cisterns, or cerebellum to explain the history. Low
density below the right putamen is likely dilated perivascular
space.

Vascular: Atherosclerotic calcification.

Skull: No acute or aggressive finding.

Sinuses/Orbits: Negative.  Clear mastoid and middle ears.
IMPRESSION: Negative head CT.

## 2019-06-19 DIAGNOSIS — Z4502 Encounter for adjustment and management of automatic implantable cardiac defibrillator: Secondary | ICD-10-CM | POA: Diagnosis not present

## 2019-06-19 DIAGNOSIS — I249 Acute ischemic heart disease, unspecified: Secondary | ICD-10-CM | POA: Diagnosis not present

## 2019-06-23 DIAGNOSIS — E782 Mixed hyperlipidemia: Secondary | ICD-10-CM | POA: Diagnosis not present

## 2019-06-23 DIAGNOSIS — R739 Hyperglycemia, unspecified: Secondary | ICD-10-CM | POA: Diagnosis not present

## 2019-06-23 DIAGNOSIS — I509 Heart failure, unspecified: Secondary | ICD-10-CM | POA: Diagnosis not present

## 2019-06-23 DIAGNOSIS — E7800 Pure hypercholesterolemia, unspecified: Secondary | ICD-10-CM | POA: Diagnosis not present

## 2019-06-23 DIAGNOSIS — N183 Chronic kidney disease, stage 3 unspecified: Secondary | ICD-10-CM | POA: Diagnosis not present

## 2019-06-23 DIAGNOSIS — I10 Essential (primary) hypertension: Secondary | ICD-10-CM | POA: Diagnosis not present

## 2019-06-30 DIAGNOSIS — F4321 Adjustment disorder with depressed mood: Secondary | ICD-10-CM | POA: Diagnosis not present

## 2019-06-30 DIAGNOSIS — R338 Other retention of urine: Secondary | ICD-10-CM | POA: Diagnosis not present

## 2019-06-30 DIAGNOSIS — I509 Heart failure, unspecified: Secondary | ICD-10-CM | POA: Diagnosis not present

## 2019-06-30 DIAGNOSIS — I2510 Atherosclerotic heart disease of native coronary artery without angina pectoris: Secondary | ICD-10-CM | POA: Diagnosis not present

## 2019-06-30 DIAGNOSIS — I4891 Unspecified atrial fibrillation: Secondary | ICD-10-CM | POA: Diagnosis not present

## 2019-06-30 DIAGNOSIS — R7301 Impaired fasting glucose: Secondary | ICD-10-CM | POA: Diagnosis not present

## 2019-06-30 DIAGNOSIS — N401 Enlarged prostate with lower urinary tract symptoms: Secondary | ICD-10-CM | POA: Diagnosis not present

## 2019-07-29 DIAGNOSIS — R5383 Other fatigue: Secondary | ICD-10-CM | POA: Diagnosis not present

## 2019-07-29 DIAGNOSIS — Z6827 Body mass index (BMI) 27.0-27.9, adult: Secondary | ICD-10-CM | POA: Diagnosis not present

## 2019-07-29 DIAGNOSIS — R2689 Other abnormalities of gait and mobility: Secondary | ICD-10-CM | POA: Diagnosis not present

## 2019-07-29 DIAGNOSIS — I255 Ischemic cardiomyopathy: Secondary | ICD-10-CM | POA: Diagnosis not present

## 2019-07-29 DIAGNOSIS — N183 Chronic kidney disease, stage 3 unspecified: Secondary | ICD-10-CM | POA: Diagnosis not present

## 2019-07-29 DIAGNOSIS — I2510 Atherosclerotic heart disease of native coronary artery without angina pectoris: Secondary | ICD-10-CM | POA: Diagnosis not present

## 2019-07-29 DIAGNOSIS — I4891 Unspecified atrial fibrillation: Secondary | ICD-10-CM | POA: Diagnosis not present

## 2019-08-04 DIAGNOSIS — Z5181 Encounter for therapeutic drug level monitoring: Secondary | ICD-10-CM | POA: Diagnosis not present

## 2019-08-04 DIAGNOSIS — Z79899 Other long term (current) drug therapy: Secondary | ICD-10-CM | POA: Diagnosis not present

## 2019-08-04 DIAGNOSIS — I249 Acute ischemic heart disease, unspecified: Secondary | ICD-10-CM | POA: Diagnosis not present

## 2019-09-18 DIAGNOSIS — Z4502 Encounter for adjustment and management of automatic implantable cardiac defibrillator: Secondary | ICD-10-CM | POA: Diagnosis not present

## 2019-09-18 DIAGNOSIS — I255 Ischemic cardiomyopathy: Secondary | ICD-10-CM | POA: Diagnosis not present

## 2019-11-14 DIAGNOSIS — I10 Essential (primary) hypertension: Secondary | ICD-10-CM | POA: Diagnosis not present

## 2019-11-14 DIAGNOSIS — E782 Mixed hyperlipidemia: Secondary | ICD-10-CM | POA: Diagnosis not present

## 2019-11-14 DIAGNOSIS — N183 Chronic kidney disease, stage 3 unspecified: Secondary | ICD-10-CM | POA: Diagnosis not present

## 2019-11-14 DIAGNOSIS — R739 Hyperglycemia, unspecified: Secondary | ICD-10-CM | POA: Diagnosis not present

## 2019-11-19 DIAGNOSIS — I509 Heart failure, unspecified: Secondary | ICD-10-CM | POA: Diagnosis not present

## 2019-11-19 DIAGNOSIS — Z6827 Body mass index (BMI) 27.0-27.9, adult: Secondary | ICD-10-CM | POA: Diagnosis not present

## 2019-11-19 DIAGNOSIS — R7301 Impaired fasting glucose: Secondary | ICD-10-CM | POA: Diagnosis not present

## 2019-11-19 DIAGNOSIS — F4321 Adjustment disorder with depressed mood: Secondary | ICD-10-CM | POA: Diagnosis not present

## 2019-11-19 DIAGNOSIS — E782 Mixed hyperlipidemia: Secondary | ICD-10-CM | POA: Diagnosis not present

## 2019-11-19 DIAGNOSIS — N401 Enlarged prostate with lower urinary tract symptoms: Secondary | ICD-10-CM | POA: Diagnosis not present

## 2019-11-19 DIAGNOSIS — I2510 Atherosclerotic heart disease of native coronary artery without angina pectoris: Secondary | ICD-10-CM | POA: Diagnosis not present

## 2019-11-25 DIAGNOSIS — C44311 Basal cell carcinoma of skin of nose: Secondary | ICD-10-CM | POA: Diagnosis not present

## 2019-11-25 DIAGNOSIS — C44319 Basal cell carcinoma of skin of other parts of face: Secondary | ICD-10-CM | POA: Diagnosis not present

## 2019-11-25 DIAGNOSIS — C44629 Squamous cell carcinoma of skin of left upper limb, including shoulder: Secondary | ICD-10-CM | POA: Diagnosis not present

## 2019-11-25 DIAGNOSIS — L570 Actinic keratosis: Secondary | ICD-10-CM | POA: Diagnosis not present

## 2019-11-25 DIAGNOSIS — D0462 Carcinoma in situ of skin of left upper limb, including shoulder: Secondary | ICD-10-CM | POA: Diagnosis not present

## 2019-11-25 DIAGNOSIS — D485 Neoplasm of uncertain behavior of skin: Secondary | ICD-10-CM | POA: Diagnosis not present

## 2019-12-04 DIAGNOSIS — C44629 Squamous cell carcinoma of skin of left upper limb, including shoulder: Secondary | ICD-10-CM | POA: Diagnosis not present

## 2019-12-04 DIAGNOSIS — C44319 Basal cell carcinoma of skin of other parts of face: Secondary | ICD-10-CM | POA: Diagnosis not present

## 2019-12-04 DIAGNOSIS — C44311 Basal cell carcinoma of skin of nose: Secondary | ICD-10-CM | POA: Diagnosis not present

## 2019-12-18 DIAGNOSIS — Z4502 Encounter for adjustment and management of automatic implantable cardiac defibrillator: Secondary | ICD-10-CM | POA: Diagnosis not present

## 2020-01-18 ENCOUNTER — Telehealth: Payer: Self-pay | Admitting: Unknown Physician Specialty

## 2020-01-18 NOTE — Telephone Encounter (Signed)
I connected by phone with Harold English on 01/18/2020 at 10:24 AM to discuss the potential use of a new treatment for mild to moderate COVID-19 viral infection in non-hospitalized patients.  This patient is a 79 y.o. male that meets the FDA criteria for Emergency Use Authorization of COVID monoclonal antibody casirivimab/imdevimab or bamlanivimab/eteseviamb.  Has a (+) direct SARS-CoV-2 viral test result  Has mild or moderate COVID-19   Is NOT hospitalized due to COVID-19  Is within 10 days of symptom onset  Has at least one of the high risk factor(s) for progression to severe COVID-19 and/or hospitalization as defined in EUA.  Specific high risk criteria : BMI > 25 and Cardiovascular disease or hypertension   I have spoken and communicated the following to the patient or parent/caregiver regarding COVID monoclonal antibody treatment:  1. FDA has authorized the emergency use for the treatment of mild to moderate COVID-19 in adults and pediatric patients with positive results of direct SARS-CoV-2 viral testing who are 1 years of age and older weighing at least 40 kg, and who are at high risk for progressing to severe COVID-19 and/or hospitalization.  2. The significant known and potential risks and benefits of COVID monoclonal antibody, and the extent to which such potential risks and benefits are unknown.  3. Information on available alternative treatments and the risks and benefits of those alternatives, including clinical trials.  4. Patients treated with COVID monoclonal antibody should continue to self-isolate and use infection control measures (e.g., wear mask, isolate, social distance, avoid sharing personal items, clean and disinfect "high touch" surfaces, and frequent handwashing) according to CDC guidelines.   5. The patient or parent/caregiver has the option to accept or refuse COVID monoclonal antibody treatment.  After reviewing this information with the patient, the  patient has DECLINED offer to receive the infusion. due to feeling better Harold Cirri, NP 01/18/2020 10:24 AM

## 2020-01-19 ENCOUNTER — Other Ambulatory Visit: Payer: Medicare PPO

## 2020-01-19 ENCOUNTER — Other Ambulatory Visit: Payer: Self-pay | Admitting: Critical Care Medicine

## 2020-01-19 DIAGNOSIS — Z20822 Contact with and (suspected) exposure to covid-19: Secondary | ICD-10-CM

## 2020-01-21 LAB — SPECIMEN STATUS REPORT

## 2020-01-21 LAB — NOVEL CORONAVIRUS, NAA: SARS-CoV-2, NAA: DETECTED — AB

## 2020-01-21 LAB — SARS-COV-2, NAA 2 DAY TAT

## 2020-02-25 DIAGNOSIS — E559 Vitamin D deficiency, unspecified: Secondary | ICD-10-CM | POA: Diagnosis not present

## 2020-02-25 DIAGNOSIS — E782 Mixed hyperlipidemia: Secondary | ICD-10-CM | POA: Diagnosis not present

## 2020-02-25 DIAGNOSIS — R739 Hyperglycemia, unspecified: Secondary | ICD-10-CM | POA: Diagnosis not present

## 2020-02-25 DIAGNOSIS — E7800 Pure hypercholesterolemia, unspecified: Secondary | ICD-10-CM | POA: Diagnosis not present

## 2020-02-25 DIAGNOSIS — N183 Chronic kidney disease, stage 3 unspecified: Secondary | ICD-10-CM | POA: Diagnosis not present

## 2020-03-02 DIAGNOSIS — N183 Chronic kidney disease, stage 3 unspecified: Secondary | ICD-10-CM | POA: Diagnosis not present

## 2020-03-02 DIAGNOSIS — F4321 Adjustment disorder with depressed mood: Secondary | ICD-10-CM | POA: Diagnosis not present

## 2020-03-02 DIAGNOSIS — N401 Enlarged prostate with lower urinary tract symptoms: Secondary | ICD-10-CM | POA: Diagnosis not present

## 2020-03-02 DIAGNOSIS — I509 Heart failure, unspecified: Secondary | ICD-10-CM | POA: Diagnosis not present

## 2020-03-02 DIAGNOSIS — R338 Other retention of urine: Secondary | ICD-10-CM | POA: Diagnosis not present

## 2020-03-02 DIAGNOSIS — Z23 Encounter for immunization: Secondary | ICD-10-CM | POA: Diagnosis not present

## 2020-03-02 DIAGNOSIS — E782 Mixed hyperlipidemia: Secondary | ICD-10-CM | POA: Diagnosis not present

## 2020-03-02 DIAGNOSIS — I2510 Atherosclerotic heart disease of native coronary artery without angina pectoris: Secondary | ICD-10-CM | POA: Diagnosis not present

## 2020-03-25 DIAGNOSIS — Z4502 Encounter for adjustment and management of automatic implantable cardiac defibrillator: Secondary | ICD-10-CM | POA: Diagnosis not present

## 2020-06-15 ENCOUNTER — Other Ambulatory Visit: Payer: Self-pay

## 2020-06-15 ENCOUNTER — Emergency Department (HOSPITAL_COMMUNITY)
Admission: EM | Admit: 2020-06-15 | Discharge: 2020-06-15 | Disposition: A | Discharge status: Home / Self Care | Payer: Medicare PPO | Attending: Emergency Medicine | Admitting: Emergency Medicine

## 2020-06-15 ENCOUNTER — Encounter (HOSPITAL_COMMUNITY): Payer: Self-pay | Admitting: Emergency Medicine

## 2020-06-15 DIAGNOSIS — R339 Retention of urine, unspecified: Secondary | ICD-10-CM | POA: Insufficient documentation

## 2020-06-15 DIAGNOSIS — Z951 Presence of aortocoronary bypass graft: Secondary | ICD-10-CM | POA: Diagnosis not present

## 2020-06-15 DIAGNOSIS — Z7901 Long term (current) use of anticoagulants: Secondary | ICD-10-CM | POA: Diagnosis not present

## 2020-06-15 DIAGNOSIS — Z87891 Personal history of nicotine dependence: Secondary | ICD-10-CM | POA: Insufficient documentation

## 2020-06-15 DIAGNOSIS — I4892 Unspecified atrial flutter: Secondary | ICD-10-CM | POA: Insufficient documentation

## 2020-06-15 DIAGNOSIS — I10 Essential (primary) hypertension: Secondary | ICD-10-CM | POA: Insufficient documentation

## 2020-06-15 DIAGNOSIS — Z79899 Other long term (current) drug therapy: Secondary | ICD-10-CM | POA: Diagnosis not present

## 2020-06-15 DIAGNOSIS — I25119 Atherosclerotic heart disease of native coronary artery with unspecified angina pectoris: Secondary | ICD-10-CM | POA: Diagnosis not present

## 2020-06-15 LAB — URINALYSIS, ROUTINE W REFLEX MICROSCOPIC
Bacteria, UA: NONE SEEN
Bilirubin Urine: NEGATIVE
Glucose, UA: NEGATIVE mg/dL
Ketones, ur: NEGATIVE mg/dL
Leukocytes,Ua: NEGATIVE
Nitrite: NEGATIVE
Protein, ur: NEGATIVE mg/dL
Specific Gravity, Urine: 1.005 (ref 1.005–1.030)
pH: 6 (ref 5.0–8.0)

## 2020-06-15 NOTE — ED Provider Notes (Signed)
[facility] EMERGENCY DEPARTMENT Provider Note   CSN: [id number] Arrival date & time: 06/15/20  9767   History Chief Complaint  Patient presents with  . Urinary Retention    Harold English is a 80 y.o. male.  The history is provided by the patient.  He has history of hypertension, hyperlipidemia, atrial flutter, ischemic cardiomyopathy and comes in because of inability to urinate.  He was last able to urinate about 9 PM.  He does have a past history of urinary retention and was treated with UroLift.  He had been doing well until tonight.  Past Medical History:  Diagnosis Date  . Atypical chest pain 09/16/2017  . CAD (coronary artery disease)   . Coronary artery disease involving coronary bypass graft of native heart with angina pectoris (HCC) 11/17/2009   Qualifier: Diagnosis of  By: Diona Browner, MD, Hollace Hayward   . Essential hypertension, benign 11/17/2009   Qualifier: Diagnosis of  By: Diona Browner, MD, Hollace Hayward   . GERD (gastroesophageal reflux disease)   . Hyperlipidemia   . Hypertension   . Ischemic cardiomyopathy   . MI (myocardial infarction) (HCC)   . Mixed hyperlipidemia 11/17/2009   Qualifier: Diagnosis of  By: Diona Browner, MD, Hollace Hayward   . Renal insufficiency   . Typical atrial flutter (HCC) 09/21/2017  . UNSPECIFIED SECONDARY CARDIOMYOPATHY 11/17/2009   Qualifier: Diagnosis of  By: Diona Browner, MD, Hollace Hayward   . Ventricular tachyarrhythmia (HCC) 09/16/2017  . Vertigo 09/21/2017    Patient Active Problem List   Diagnosis Date Noted  . Vertigo 09/21/2017  . Typical atrial flutter (HCC) 09/21/2017  . Atypical chest pain 09/16/2017  . Ventricular tachyarrhythmia (HCC) 09/16/2017  . Ischemic cardiomyopathy 01/10/2010  . Mixed hyperlipidemia 11/17/2009  . TOBACCO ABUSE 11/17/2009  . Essential hypertension, benign 11/17/2009  . Coronary artery disease involving coronary bypass graft of native heart with angina pectoris (HCC)  11/17/2009  . UNSPECIFIED SECONDARY CARDIOMYOPATHY 11/17/2009  . SHOULDER PAIN 06/07/2009  . IMPINGEMENT SYNDROME 06/07/2009    Past Surgical History:  Procedure Laterality Date  . ADENOIDECTOMY    . BACK SURGERY    . CARDIAC SURGERY    . CORONARY ARTERY BYPASS GRAFT    . CORONARY STENT INTERVENTION N/A 09/18/2017   Procedure: CORONARY STENT INTERVENTION;  Surgeon: Tonny Bollman, MD;  Location: [facility] INVASIVE CV LAB;  Service: Cardiovascular;  Laterality: N/A;  . CYSTOSCOPY WITH INSERTION OF UROLIFT N/A 10/07/2018   Procedure: CYSTOSCOPY WITH INSERTION OF UROLIFT;  Surgeon: Malen Gauze, MD;  Location: [facility];  Service: Urology;  Laterality: N/A;  30 MINS  . LEFT HEART CATH AND CORS/GRAFTS ANGIOGRAPHY N/A 09/18/2017   Procedure: LEFT HEART CATH AND CORS/GRAFTS ANGIOGRAPHY;  Surgeon: Tonny Bollman, MD;  Location: [facility] INVASIVE CV LAB;  Service: Cardiovascular;  Laterality: N/A;  . TONSILLECTOMY         Family History  Problem Relation Age of Onset  . CAD Father     Social History   Tobacco Use  . Smoking status: Former Smoker    Packs/day: 0.75    Years: 0.00    Pack years: 0.00    Types: Cigarettes    Quit date: 09/19/2017    Years since quitting: 2.7  . Smokeless tobacco: Never Used  Vaping Use  . Vaping Use: Never used  Substance Use Topics  . Alcohol use: Yes    Comment: 1 drink every 3 days   . Drug use: No    Home Medications  Prior to Admission medications   Medication Sig Start Date End Date Taking? Authorizing Provider  amiodarone (PACERONE) 200 MG tablet TAKE 1 TABLET BY MOUTH EVERY DAY 01/10/19   Azalee Course, PA  atorvastatin (LIPITOR) 40 MG tablet Take 1 tablet (40 mg total) by mouth at bedtime. 10/24/17   Azalee Course, PA  carvedilol (COREG) 6.25 MG tablet Take 1 tablet (6.25 mg total) by mouth 2 (two) times daily with a meal. 10/24/17   Lisabeth Devoid, Stark, PA  digoxin (LANOXIN) 0.125 MG tablet TAKE HALF A TABLET BY MOUTH DAILY. 01/15/19   Chilton Si, MD  ELIQUIS  5 MG TABS tablet TAKE 1 TABLET BY MOUTH TWICE A DAY 11/19/18   Lyn Records, MD  furosemide (LASIX) 40 MG tablet Take 40 mg by mouth daily.    [provider]  tamsulosin (FLOMAX) 0.4 MG CAPS capsule Take 0.4 mg by mouth 2 (two) times a day.    [provider]    Allergies    Sulfonamide derivatives  Review of Systems   Review of Systems  All other systems reviewed and are negative.   Physical Exam Updated Vital Signs BP 126/82 (BP Location: Left Arm)   Pulse 70   Temp 97.9 F (36.6 C)   Resp 18   Ht 6\' 2"  (1.88 m)   Wt 90.7 kg   SpO2 95%   BMI 25.68 kg/m   Physical Exam Vitals and nursing note reviewed.   80 year old male, resting comfortably and in no acute distress. Vital signs are normal. Oxygen saturation is 95%, which is normal. Head is normocephalic and atraumatic. PERRLA, EOMI. Oropharynx is clear. Neck is nontender and supple without adenopathy or JVD. Back is nontender and there is no CVA tenderness. Lungs are clear without rales, wheezes, or rhonchi. Chest is nontender. Heart has regular rate and rhythm without murmur. Abdomen is soft, flat, nontender without masses or hepatosplenomegaly and peristalsis is normoactive. Extremities have no cyanosis or edema, full range of motion is present. Skin is warm and dry without rash. Neurologic: Mental status is normal, cranial nerves are intact, there are no motor or sensory deficits.  ED Results / Procedures / Treatments   Labs (all labs ordered are listed, but only abnormal results are displayed) Labs Reviewed  URINALYSIS, ROUTINE W REFLEX MICROSCOPIC   Procedures Procedures   Medications Ordered in ED Medications - No data to display  ED Course  I have reviewed the triage vital signs and the nursing notes.  Pertinent lab results that were available during my care of the patient were reviewed by me and considered in my medical decision making (see chart for details).  MDM  Rules/Calculators/A&P Acute urinary retention.  Foley catheter been placed prior to my seeing the patient and bladder is not distended at this time.  1100 mL of urine was drained.  He is discharged with catheter in place, and is referred back to his urologist.  Final Clinical Impression(s) / ED Diagnoses Final diagnoses:  Urinary retention    Rx / DC Orders ED Discharge Orders    None       62, MD 06/15/20 [phone]

## 2020-06-15 NOTE — ED Triage Notes (Signed)
Pt states he hasn't been able to urinate since 9pm and is "really uncomfortable".

## 2020-06-19 DIAGNOSIS — Z20822 Contact with and (suspected) exposure to covid-19: Secondary | ICD-10-CM | POA: Diagnosis not present

## 2020-06-19 DIAGNOSIS — N189 Chronic kidney disease, unspecified: Secondary | ICD-10-CM | POA: Diagnosis not present

## 2020-06-19 DIAGNOSIS — R509 Fever, unspecified: Secondary | ICD-10-CM | POA: Diagnosis not present

## 2020-06-19 DIAGNOSIS — N390 Urinary tract infection, site not specified: Secondary | ICD-10-CM | POA: Diagnosis not present

## 2020-06-19 DIAGNOSIS — A4189 Other specified sepsis: Secondary | ICD-10-CM | POA: Diagnosis not present

## 2020-06-19 DIAGNOSIS — I517 Cardiomegaly: Secondary | ICD-10-CM | POA: Diagnosis not present

## 2020-06-19 DIAGNOSIS — Z7983 Long term (current) use of bisphosphonates: Secondary | ICD-10-CM | POA: Diagnosis not present

## 2020-06-19 DIAGNOSIS — B9689 Other specified bacterial agents as the cause of diseases classified elsewhere: Secondary | ICD-10-CM | POA: Diagnosis not present

## 2020-06-19 DIAGNOSIS — B965 Pseudomonas (aeruginosa) (mallei) (pseudomallei) as the cause of diseases classified elsewhere: Secondary | ICD-10-CM | POA: Diagnosis not present

## 2020-06-19 DIAGNOSIS — I361 Nonrheumatic tricuspid (valve) insufficiency: Secondary | ICD-10-CM | POA: Diagnosis not present

## 2020-06-19 DIAGNOSIS — T83511A Infection and inflammatory reaction due to indwelling urethral catheter, initial encounter: Secondary | ICD-10-CM | POA: Diagnosis not present

## 2020-06-19 DIAGNOSIS — Z960 Presence of urogenital implants: Secondary | ICD-10-CM | POA: Diagnosis not present

## 2020-06-19 DIAGNOSIS — I472 Ventricular tachycardia: Secondary | ICD-10-CM | POA: Diagnosis not present

## 2020-06-19 DIAGNOSIS — I959 Hypotension, unspecified: Secondary | ICD-10-CM | POA: Diagnosis not present

## 2020-06-19 DIAGNOSIS — I130 Hypertensive heart and chronic kidney disease with heart failure and stage 1 through stage 4 chronic kidney disease, or unspecified chronic kidney disease: Secondary | ICD-10-CM | POA: Diagnosis not present

## 2020-06-19 DIAGNOSIS — I252 Old myocardial infarction: Secondary | ICD-10-CM | POA: Diagnosis not present

## 2020-06-19 DIAGNOSIS — I509 Heart failure, unspecified: Secondary | ICD-10-CM | POA: Diagnosis not present

## 2020-06-19 DIAGNOSIS — I480 Paroxysmal atrial fibrillation: Secondary | ICD-10-CM | POA: Diagnosis not present

## 2020-06-19 DIAGNOSIS — Z720 Tobacco use: Secondary | ICD-10-CM | POA: Diagnosis not present

## 2020-06-19 DIAGNOSIS — R0600 Dyspnea, unspecified: Secondary | ICD-10-CM | POA: Diagnosis not present

## 2020-06-19 DIAGNOSIS — N400 Enlarged prostate without lower urinary tract symptoms: Secondary | ICD-10-CM | POA: Diagnosis not present

## 2020-06-19 DIAGNOSIS — I5189 Other ill-defined heart diseases: Secondary | ICD-10-CM | POA: Diagnosis not present

## 2020-06-19 DIAGNOSIS — I255 Ischemic cardiomyopathy: Secondary | ICD-10-CM | POA: Diagnosis not present

## 2020-06-19 DIAGNOSIS — I4891 Unspecified atrial fibrillation: Secondary | ICD-10-CM | POA: Diagnosis not present

## 2020-06-19 DIAGNOSIS — R531 Weakness: Secondary | ICD-10-CM | POA: Diagnosis not present

## 2020-06-19 DIAGNOSIS — Z978 Presence of other specified devices: Secondary | ICD-10-CM | POA: Diagnosis not present

## 2020-06-19 DIAGNOSIS — I5022 Chronic systolic (congestive) heart failure: Secondary | ICD-10-CM | POA: Diagnosis not present

## 2020-06-19 DIAGNOSIS — Z4502 Encounter for adjustment and management of automatic implantable cardiac defibrillator: Secondary | ICD-10-CM | POA: Diagnosis not present

## 2020-06-19 DIAGNOSIS — I4892 Unspecified atrial flutter: Secondary | ICD-10-CM | POA: Diagnosis not present

## 2020-06-19 DIAGNOSIS — Z792 Long term (current) use of antibiotics: Secondary | ICD-10-CM | POA: Diagnosis not present

## 2020-06-19 DIAGNOSIS — Z79899 Other long term (current) drug therapy: Secondary | ICD-10-CM | POA: Diagnosis not present

## 2020-06-19 DIAGNOSIS — A419 Sepsis, unspecified organism: Secondary | ICD-10-CM | POA: Diagnosis not present

## 2020-06-19 DIAGNOSIS — Z95810 Presence of automatic (implantable) cardiac defibrillator: Secondary | ICD-10-CM | POA: Diagnosis not present

## 2020-06-19 DIAGNOSIS — R7881 Bacteremia: Secondary | ICD-10-CM | POA: Diagnosis not present

## 2020-06-24 DIAGNOSIS — Z4502 Encounter for adjustment and management of automatic implantable cardiac defibrillator: Secondary | ICD-10-CM | POA: Diagnosis not present

## 2020-06-24 DIAGNOSIS — N390 Urinary tract infection, site not specified: Secondary | ICD-10-CM | POA: Diagnosis not present

## 2020-06-24 DIAGNOSIS — I252 Old myocardial infarction: Secondary | ICD-10-CM | POA: Diagnosis not present

## 2020-06-24 DIAGNOSIS — A419 Sepsis, unspecified organism: Secondary | ICD-10-CM | POA: Diagnosis not present

## 2020-06-24 DIAGNOSIS — I509 Heart failure, unspecified: Secondary | ICD-10-CM | POA: Diagnosis not present

## 2020-06-28 ENCOUNTER — Other Ambulatory Visit (HOSPITAL_COMMUNITY)
Admission: RE | Admit: 2020-06-28 | Discharge: 2020-06-28 | Disposition: A | Discharge status: Home / Self Care | Payer: Medicare PPO | Source: Other Acute Inpatient Hospital | Attending: Family Medicine | Admitting: Family Medicine

## 2020-06-28 DIAGNOSIS — N390 Urinary tract infection, site not specified: Secondary | ICD-10-CM | POA: Insufficient documentation

## 2020-06-28 DIAGNOSIS — A419 Sepsis, unspecified organism: Secondary | ICD-10-CM | POA: Diagnosis not present

## 2020-06-28 LAB — CBC WITH DIFFERENTIAL/PLATELET
Abs Immature Granulocytes: 0.21 10*3/uL — ABNORMAL HIGH (ref 0.00–0.07)
Basophils Absolute: 0.1 10*3/uL (ref 0.0–0.1)
Basophils Relative: 1 %
Eosinophils Absolute: 0.2 10*3/uL (ref 0.0–0.5)
Eosinophils Relative: 2 %
HCT: 41.9 % (ref 39.0–52.0)
Hemoglobin: 13.6 g/dL (ref 13.0–17.0)
Immature Granulocytes: 2 %
Lymphocytes Relative: 15 %
Lymphs Abs: 1.5 10*3/uL (ref 0.7–4.0)
MCH: 31.6 pg (ref 26.0–34.0)
MCHC: 32.5 g/dL (ref 30.0–36.0)
MCV: 97.4 fL (ref 80.0–100.0)
Monocytes Absolute: 0.6 10*3/uL (ref 0.1–1.0)
Monocytes Relative: 7 %
Neutro Abs: 7.3 10*3/uL (ref 1.7–7.7)
Neutrophils Relative %: 73 %
Platelets: 212 10*3/uL (ref 150–400)
RBC: 4.3 MIL/uL (ref 4.22–5.81)
RDW: 13.7 % (ref 11.5–15.5)
WBC: 9.9 10*3/uL (ref 4.0–10.5)
nRBC: 0 % (ref 0.0–0.2)

## 2020-06-28 LAB — BASIC METABOLIC PANEL
Anion gap: 7 (ref 5–15)
BUN: 15 mg/dL (ref 8–23)
CO2: 27 mmol/L (ref 22–32)
Calcium: 8.8 mg/dL — ABNORMAL LOW (ref 8.9–10.3)
Chloride: 105 mmol/L (ref 98–111)
Creatinine, Ser: 1.47 mg/dL — ABNORMAL HIGH (ref 0.61–1.24)
GFR, Estimated: 49 mL/min — ABNORMAL LOW (ref >60)
Glucose, Bld: 152 mg/dL — ABNORMAL HIGH (ref 70–99)
Potassium: 4 mmol/L (ref 3.5–5.1)
Sodium: 139 mmol/L (ref 135–145)

## 2020-06-30 ENCOUNTER — Other Ambulatory Visit: Payer: Self-pay

## 2020-06-30 ENCOUNTER — Encounter: Payer: Self-pay | Admitting: Urology

## 2020-06-30 ENCOUNTER — Ambulatory Visit (INDEPENDENT_AMBULATORY_CARE_PROVIDER_SITE_OTHER): Payer: Medicare PPO | Admitting: Urology

## 2020-06-30 VITALS — BP 112/61 | HR 71 | Temp 97.7°F | Ht 74.0 in | Wt 200.0 lb

## 2020-06-30 DIAGNOSIS — R339 Retention of urine, unspecified: Secondary | ICD-10-CM | POA: Diagnosis not present

## 2020-06-30 DIAGNOSIS — N401 Enlarged prostate with lower urinary tract symptoms: Secondary | ICD-10-CM

## 2020-06-30 DIAGNOSIS — N138 Other obstructive and reflux uropathy: Secondary | ICD-10-CM

## 2020-06-30 MED ORDER — TAMSULOSIN HCL 0.4 MG PO CAPS: 0.4000 mg | ORAL_CAPSULE | Freq: Every day | ORAL | 11 refills | Status: DC

## 2020-06-30 NOTE — Progress Notes (Signed)
Fill and Pull Catheter Removal  Patient is present today for a catheter removal.  Patient was cleaned and prepped in a sterile fashion of sterile water/ saline was instilled into the bladder when the patient felt the urge to urinate. 14ml of water was then drained from the balloon.  A 16FR foley cath was removed from the bladder no complications were noted .  Patient as then given some time to void on their own.  Patient can void  on their own after some time.  Patient tolerated well.  Performed by: Morningstar Toft,Lpn  Uroflow  Peak Flow: 44ml Average Flow: 39ml Voided Volume: Voiding Time: 61sec Flow Time: 23sec Time to Peak Flow: 56sec

## 2020-06-30 NOTE — Progress Notes (Signed)
06/30/2020 10:22 AM   Harold English 04-25-1940 [id number]  Referring provider: Estanislado Pandy, MD [address] [place],  [place] [address]  Urinary retention  HPI: Harold English is a 79yo here for followup for BPH and urinary retention. He was admitted on 2/26 with UTI and urinary retention. Foley placed and 1100cc drained. He was discharged on IV antibiotics. He is off flomax 0.4mg . Voiding passed today. He was voiding well prior to admission to hospital. No prior hx of UTI. He had a urolift in 09/2018   PMH: Past Medical History:  Diagnosis Date  . Atypical chest pain 09/16/2017  . CAD (coronary artery disease)   . Coronary artery disease involving coronary bypass graft of native heart with angina pectoris (HCC) 11/17/2009   Qualifier: Diagnosis of  By: Diona Browner, MD, Hollace Hayward   . Essential hypertension, benign 11/17/2009   Qualifier: Diagnosis of  By: Diona Browner, MD, Hollace Hayward   . GERD (gastroesophageal reflux disease)   . Hyperlipidemia   . Hypertension   . Ischemic cardiomyopathy   . MI (myocardial infarction) (HCC)   . Mixed hyperlipidemia 11/17/2009   Qualifier: Diagnosis of  By: Diona Browner, MD, Hollace Hayward   . Renal insufficiency   . Typical atrial flutter (HCC) 09/21/2017  . UNSPECIFIED SECONDARY CARDIOMYOPATHY 11/17/2009   Qualifier: Diagnosis of  By: Diona Browner, MD, Hollace Hayward   . Ventricular tachyarrhythmia (HCC) 09/16/2017  . Vertigo 09/21/2017    Surgical History: Past Surgical History:  Procedure Laterality Date  . ADENOIDECTOMY    . BACK SURGERY    . CARDIAC SURGERY    . CORONARY ARTERY BYPASS GRAFT    . CORONARY STENT INTERVENTION N/A 09/18/2017   Procedure: CORONARY STENT INTERVENTION;  Surgeon: Tonny Bollman, MD;  Location: [facility] INVASIVE CV LAB;  Service: Cardiovascular;  Laterality: N/A;  . CYSTOSCOPY WITH INSERTION OF UROLIFT N/A 10/07/2018   Procedure: CYSTOSCOPY WITH INSERTION OF UROLIFT;  Surgeon: Malen Gauze, MD;   Location: [facility];  Service: Urology;  Laterality: N/A;  30 MINS  . LEFT HEART CATH AND CORS/GRAFTS ANGIOGRAPHY N/A 09/18/2017   Procedure: LEFT HEART CATH AND CORS/GRAFTS ANGIOGRAPHY;  Surgeon: Tonny Bollman, MD;  Location: [facility] INVASIVE CV LAB;  Service: Cardiovascular;  Laterality: N/A;  . TONSILLECTOMY      Home Medications:  Allergies as of 06/30/2020      Reactions   Sulfa Antibiotics    Sulfonamide Derivatives    Childhood       Medication List       Accurate as of June 30, 2020 10:22 AM. If you have any questions, ask your nurse or doctor.        amiodarone 200 MG tablet Commonly known as: PACERONE TAKE 1 TABLET BY MOUTH EVERY DAY   atorvastatin 40 MG tablet Commonly known as: LIPITOR Take 1 tablet (40 mg total) by mouth at bedtime.   carvedilol 6.25 MG tablet Commonly known as: COREG Take 1 tablet (6.25 mg total) by mouth 2 (two) times daily with a meal.   digoxin 0.125 MG tablet Commonly known as: LANOXIN TAKE HALF A TABLET BY MOUTH DAILY.   Eliquis 5 MG Tabs tablet Generic drug: apixaban TAKE 1 TABLET BY MOUTH TWICE A DAY   furosemide 40 MG tablet Commonly known as: LASIX Take 40 mg by mouth daily.   metoprolol succinate 25 MG 24 hr tablet Commonly known as: TOPROL-XL Take 0.5 tablets by mouth daily.   tamsulosin 0.4 MG Caps capsule  Commonly known as: FLOMAX Take 0.4 mg by mouth 2 (two) times a day. What changed: Another medication with the same name was added. Make sure you understand how and when to take each. Changed by: Wilkie Aye, MD   tamsulosin 0.4 MG Caps capsule Commonly known as: FLOMAX Take 1 capsule (0.4 mg total) by mouth daily. What changed: You were already taking a medication with the same name, and this prescription was added. Make sure you understand how and when to take each. Changed by: Wilkie Aye, MD       Allergies:  Allergies  Allergen Reactions  . Sulfa Antibiotics   . Sulfonamide Derivatives     Childhood      Family History: Family History  Problem Relation Age of Onset  . CAD Father     Social History:  reports that he quit smoking about 2 years ago. His smoking use included cigarettes. He smoked 0.75 packs per day for 0.00 years. He has never used smokeless tobacco. He reports current alcohol use. He reports that he does not use drugs.  ROS: All other review of systems were reviewed and are negative except what is noted above in HPI  Physical Exam: BP 112/61   Pulse 71   Temp 97.7 F (36.5 C)   Ht 6\' 2"  (1.88 m)   Wt 200 lb (90.7 kg)   BMI 25.68 kg/m   Constitutional:  Alert and oriented, No acute distress. HEENT: [place] AT, moist mucus membranes.  Trachea midline, no masses. Cardiovascular: No clubbing, cyanosis, or edema. Respiratory: Normal respiratory effort, no increased work of breathing. GI: Abdomen is soft, nontender, nondistended, no abdominal masses GU: No CVA tenderness.  Lymph: No cervical or inguinal lymphadenopathy. Skin: No rashes, bruises or suspicious lesions. Neurologic: Grossly intact, no focal deficits, moving all 4 extremities. Psychiatric: Normal mood and affect.  Laboratory Data: Lab Results  Component Value Date   WBC 9.9 06/28/2020   HGB 13.6 06/28/2020   HCT 41.9 06/28/2020   MCV 97.4 06/28/2020   PLT 212 06/28/2020    Lab Results  Component Value Date   CREATININE 1.47 (H) 06/28/2020    No results found for: PSA  No results found for: TESTOSTERONE  Lab Results  Component Value Date   HGBA1C 5.5 09/16/2017    Urinalysis    Component Value Date/Time   COLORURINE STRAW (A) 06/15/2020 0402   APPEARANCEUR CLEAR 06/15/2020 0402   LABSPEC 1.005 06/15/2020 0402   PHURINE 6.0 06/15/2020 0402   GLUCOSEU NEGATIVE 06/15/2020 0402   HGBUR SMALL (A) 06/15/2020 0402   BILIRUBINUR NEGATIVE 06/15/2020 0402   KETONESUR NEGATIVE 06/15/2020 0402   PROTEINUR NEGATIVE 06/15/2020 0402   NITRITE NEGATIVE 06/15/2020 0402   LEUKOCYTESUR NEGATIVE  06/15/2020 0402    Lab Results  Component Value Date   BACTERIA NONE SEEN 06/15/2020    Pertinent Imaging:  No results found for this or any previous visit.  No results found for this or any previous visit.  No results found for this or any previous visit.  No results found for this or any previous visit.  Results for orders placed during the hospital encounter of 09/16/17  09/18/17 RENAL  Narrative CLINICAL DATA:  Chronic renal disease  EXAM: RENAL / URINARY TRACT ULTRASOUND COMPLETE  COMPARISON:  None.  FINDINGS: Right Kidney:  Length: 10.3 cm. Echogenicity and renal cortical thickness are within normal limits. No perinephric fluid or hydronephrosis visualized. There is a cyst arising eccentrically from the lower pole left kidney  measuring 4.8 x 4.4 x 3.7 cm. No sonographically demonstrable calculus or ureterectasis.  Left Kidney:  Length: 11.5 cm. Echogenicity and renal cortical thickness are within normal limits. No perinephric fluid or hydronephrosis visualized. There is a cyst arising from upper to mid left kidney measuring 1.4 x 1.3 x 1.3 cm. No sonographically demonstrable calculus or ureterectasis.  Bladder:  Appears normal for degree of bladder distention.  IMPRESSION: Cyst in each kidney, considerably larger on the right than on the left. Study otherwise unremarkable.   Electronically Signed By: Bretta Bang III M.D. On: 09/17/2017 08:39  No results found for this or any previous visit.  No results found for this or any previous visit.  No results found for this or any previous visit.   Assessment & Plan:    1. Benign prostatic hyperplasia with urinary obstruction -restart flomax 0.4mg  daily  2. Urinary retention -RTC 1 week for PVR. Voiding trial passed today   Return in about 4 weeks (around 07/28/2020) for 1 week NV PVR and 4 weeks PVR with MD.  Wilkie Aye, MD  [facility] [place]

## 2020-06-30 NOTE — Patient Instructions (Signed)

## 2020-06-30 NOTE — Progress Notes (Signed)

## 2020-07-02 DIAGNOSIS — A419 Sepsis, unspecified organism: Secondary | ICD-10-CM | POA: Diagnosis not present

## 2020-07-02 DIAGNOSIS — N390 Urinary tract infection, site not specified: Secondary | ICD-10-CM | POA: Diagnosis not present

## 2020-07-05 ENCOUNTER — Other Ambulatory Visit (HOSPITAL_COMMUNITY)
Admission: RE | Admit: 2020-07-05 | Discharge: 2020-07-05 | Disposition: A | Discharge status: Home / Self Care | Payer: Medicare PPO | Source: Other Acute Inpatient Hospital | Attending: Family Medicine | Admitting: Family Medicine

## 2020-07-05 DIAGNOSIS — N390 Urinary tract infection, site not specified: Secondary | ICD-10-CM | POA: Diagnosis not present

## 2020-07-05 DIAGNOSIS — A419 Sepsis, unspecified organism: Secondary | ICD-10-CM | POA: Insufficient documentation

## 2020-07-05 LAB — CBC WITH DIFFERENTIAL/PLATELET
Abs Immature Granulocytes: 0.04 10*3/uL (ref 0.00–0.07)
Basophils Absolute: 0.1 10*3/uL (ref 0.0–0.1)
Basophils Relative: 1 %
Eosinophils Absolute: 0.2 10*3/uL (ref 0.0–0.5)
Eosinophils Relative: 2 %
HCT: 41.5 % (ref 39.0–52.0)
Hemoglobin: 13.3 g/dL (ref 13.0–17.0)
Immature Granulocytes: 0 %
Lymphocytes Relative: 22 %
Lymphs Abs: 2.1 10*3/uL (ref 0.7–4.0)
MCH: 31.9 pg (ref 26.0–34.0)
MCHC: 32 g/dL (ref 30.0–36.0)
MCV: 99.5 fL (ref 80.0–100.0)
Monocytes Absolute: 0.9 10*3/uL (ref 0.1–1.0)
Monocytes Relative: 9 %
Neutro Abs: 6.4 10*3/uL (ref 1.7–7.7)
Neutrophils Relative %: 66 %
Platelets: 144 10*3/uL — ABNORMAL LOW (ref 150–400)
RBC: 4.17 MIL/uL — ABNORMAL LOW (ref 4.22–5.81)
RDW: 14.4 % (ref 11.5–15.5)
WBC: 9.7 10*3/uL (ref 4.0–10.5)
nRBC: 0 % (ref 0.0–0.2)

## 2020-07-05 LAB — BASIC METABOLIC PANEL
Anion gap: 10 (ref 5–15)
BUN: 18 mg/dL (ref 8–23)
CO2: 24 mmol/L (ref 22–32)
Calcium: 8.6 mg/dL — ABNORMAL LOW (ref 8.9–10.3)
Chloride: 105 mmol/L (ref 98–111)
Creatinine, Ser: 1.51 mg/dL — ABNORMAL HIGH (ref 0.61–1.24)
GFR, Estimated: 47 mL/min — ABNORMAL LOW (ref >60)
Glucose, Bld: 38 mg/dL — CL (ref 70–99)
Potassium: 4.1 mmol/L (ref 3.5–5.1)
Sodium: 139 mmol/L (ref 135–145)

## 2020-07-05 LAB — C-REACTIVE PROTEIN: CRP: 0.5 mg/dL (ref <1.0)

## 2020-07-05 LAB — SEDIMENTATION RATE: Sed Rate: 13 mm/hr (ref 0–16)

## 2020-07-08 ENCOUNTER — Other Ambulatory Visit: Payer: Self-pay

## 2020-07-08 ENCOUNTER — Ambulatory Visit (INDEPENDENT_AMBULATORY_CARE_PROVIDER_SITE_OTHER): Payer: Medicare PPO

## 2020-07-08 DIAGNOSIS — R339 Retention of urine, unspecified: Secondary | ICD-10-CM

## 2020-07-08 LAB — BLADDER SCAN AMB NON-IMAGING: Scan Result: 84

## 2020-07-08 NOTE — Progress Notes (Signed)
Bladder Scan Patient can void: 84 ml Performed By: Bridgette Habermann, lpn  Patient will keep next scheduled appointment.

## 2020-07-09 DIAGNOSIS — N390 Urinary tract infection, site not specified: Secondary | ICD-10-CM | POA: Diagnosis not present

## 2020-07-09 DIAGNOSIS — A419 Sepsis, unspecified organism: Secondary | ICD-10-CM | POA: Diagnosis not present

## 2020-07-24 DIAGNOSIS — I482 Chronic atrial fibrillation, unspecified: Secondary | ICD-10-CM | POA: Diagnosis not present

## 2020-07-24 DIAGNOSIS — N183 Chronic kidney disease, stage 3 unspecified: Secondary | ICD-10-CM | POA: Diagnosis not present

## 2020-07-24 DIAGNOSIS — I4891 Unspecified atrial fibrillation: Secondary | ICD-10-CM | POA: Diagnosis not present

## 2020-07-24 DIAGNOSIS — I2510 Atherosclerotic heart disease of native coronary artery without angina pectoris: Secondary | ICD-10-CM | POA: Diagnosis not present

## 2020-07-24 DIAGNOSIS — A419 Sepsis, unspecified organism: Secondary | ICD-10-CM | POA: Diagnosis not present

## 2020-07-24 DIAGNOSIS — I509 Heart failure, unspecified: Secondary | ICD-10-CM | POA: Diagnosis not present

## 2020-07-24 DIAGNOSIS — N390 Urinary tract infection, site not specified: Secondary | ICD-10-CM | POA: Diagnosis not present

## 2020-07-27 DIAGNOSIS — I5020 Unspecified systolic (congestive) heart failure: Secondary | ICD-10-CM | POA: Diagnosis not present

## 2020-07-27 DIAGNOSIS — E782 Mixed hyperlipidemia: Secondary | ICD-10-CM | POA: Diagnosis not present

## 2020-07-27 DIAGNOSIS — I4891 Unspecified atrial fibrillation: Secondary | ICD-10-CM | POA: Diagnosis not present

## 2020-07-27 DIAGNOSIS — I472 Ventricular tachycardia: Secondary | ICD-10-CM | POA: Diagnosis not present

## 2020-07-27 DIAGNOSIS — I4892 Unspecified atrial flutter: Secondary | ICD-10-CM | POA: Diagnosis not present

## 2020-07-27 DIAGNOSIS — I255 Ischemic cardiomyopathy: Secondary | ICD-10-CM | POA: Diagnosis not present

## 2020-07-30 ENCOUNTER — Ambulatory Visit: Payer: Medicare PPO | Admitting: Urology

## 2020-07-30 DIAGNOSIS — R339 Retention of urine, unspecified: Secondary | ICD-10-CM

## 2020-07-30 DIAGNOSIS — N138 Other obstructive and reflux uropathy: Secondary | ICD-10-CM

## 2020-08-09 DIAGNOSIS — I130 Hypertensive heart and chronic kidney disease with heart failure and stage 1 through stage 4 chronic kidney disease, or unspecified chronic kidney disease: Secondary | ICD-10-CM | POA: Diagnosis not present

## 2020-08-09 DIAGNOSIS — Z79899 Other long term (current) drug therapy: Secondary | ICD-10-CM | POA: Diagnosis not present

## 2020-08-09 DIAGNOSIS — I249 Acute ischemic heart disease, unspecified: Secondary | ICD-10-CM | POA: Diagnosis not present

## 2020-08-09 DIAGNOSIS — I4892 Unspecified atrial flutter: Secondary | ICD-10-CM | POA: Diagnosis not present

## 2020-08-09 DIAGNOSIS — I472 Ventricular tachycardia: Secondary | ICD-10-CM | POA: Diagnosis not present

## 2020-08-09 DIAGNOSIS — I255 Ischemic cardiomyopathy: Secondary | ICD-10-CM | POA: Diagnosis not present

## 2020-08-09 DIAGNOSIS — I2510 Atherosclerotic heart disease of native coronary artery without angina pectoris: Secondary | ICD-10-CM | POA: Diagnosis not present

## 2020-08-09 DIAGNOSIS — R251 Tremor, unspecified: Secondary | ICD-10-CM | POA: Diagnosis not present

## 2020-08-09 DIAGNOSIS — I4891 Unspecified atrial fibrillation: Secondary | ICD-10-CM | POA: Diagnosis not present

## 2020-08-09 DIAGNOSIS — I5022 Chronic systolic (congestive) heart failure: Secondary | ICD-10-CM | POA: Diagnosis not present

## 2020-08-23 DIAGNOSIS — E7801 Familial hypercholesterolemia: Secondary | ICD-10-CM | POA: Diagnosis not present

## 2020-08-23 DIAGNOSIS — E7849 Other hyperlipidemia: Secondary | ICD-10-CM | POA: Diagnosis not present

## 2020-08-23 DIAGNOSIS — N183 Chronic kidney disease, stage 3 unspecified: Secondary | ICD-10-CM | POA: Diagnosis not present

## 2020-08-23 DIAGNOSIS — E782 Mixed hyperlipidemia: Secondary | ICD-10-CM | POA: Diagnosis not present

## 2020-08-23 DIAGNOSIS — I482 Chronic atrial fibrillation, unspecified: Secondary | ICD-10-CM | POA: Diagnosis not present

## 2020-08-23 DIAGNOSIS — E7800 Pure hypercholesterolemia, unspecified: Secondary | ICD-10-CM | POA: Diagnosis not present

## 2020-08-23 DIAGNOSIS — E559 Vitamin D deficiency, unspecified: Secondary | ICD-10-CM | POA: Diagnosis not present

## 2020-08-23 DIAGNOSIS — R5383 Other fatigue: Secondary | ICD-10-CM | POA: Diagnosis not present

## 2020-09-01 DIAGNOSIS — F4321 Adjustment disorder with depressed mood: Secondary | ICD-10-CM | POA: Diagnosis not present

## 2020-09-01 DIAGNOSIS — I2510 Atherosclerotic heart disease of native coronary artery without angina pectoris: Secondary | ICD-10-CM | POA: Diagnosis not present

## 2020-09-01 DIAGNOSIS — I509 Heart failure, unspecified: Secondary | ICD-10-CM | POA: Diagnosis not present

## 2020-09-01 DIAGNOSIS — I4891 Unspecified atrial fibrillation: Secondary | ICD-10-CM | POA: Diagnosis not present

## 2020-09-01 DIAGNOSIS — E7849 Other hyperlipidemia: Secondary | ICD-10-CM | POA: Diagnosis not present

## 2020-09-01 DIAGNOSIS — N401 Enlarged prostate with lower urinary tract symptoms: Secondary | ICD-10-CM | POA: Diagnosis not present

## 2020-09-01 DIAGNOSIS — E559 Vitamin D deficiency, unspecified: Secondary | ICD-10-CM | POA: Diagnosis not present

## 2020-09-01 DIAGNOSIS — N189 Chronic kidney disease, unspecified: Secondary | ICD-10-CM | POA: Diagnosis not present

## 2020-09-02 DIAGNOSIS — I249 Acute ischemic heart disease, unspecified: Secondary | ICD-10-CM | POA: Diagnosis not present

## 2020-09-23 DIAGNOSIS — Z4502 Encounter for adjustment and management of automatic implantable cardiac defibrillator: Secondary | ICD-10-CM | POA: Diagnosis not present

## 2020-09-23 DIAGNOSIS — I472 Ventricular tachycardia: Secondary | ICD-10-CM | POA: Diagnosis not present

## 2020-10-06 ENCOUNTER — Ambulatory Visit: Payer: Medicare PPO | Admitting: Urology

## 2020-10-11 DIAGNOSIS — Z95810 Presence of automatic (implantable) cardiac defibrillator: Secondary | ICD-10-CM | POA: Diagnosis not present

## 2020-10-11 DIAGNOSIS — I472 Ventricular tachycardia: Secondary | ICD-10-CM | POA: Diagnosis not present

## 2020-10-11 DIAGNOSIS — Z950 Presence of cardiac pacemaker: Secondary | ICD-10-CM | POA: Diagnosis not present

## 2020-10-11 DIAGNOSIS — I5020 Unspecified systolic (congestive) heart failure: Secondary | ICD-10-CM | POA: Diagnosis not present

## 2020-10-11 DIAGNOSIS — I4891 Unspecified atrial fibrillation: Secondary | ICD-10-CM | POA: Diagnosis not present

## 2020-10-11 DIAGNOSIS — I4892 Unspecified atrial flutter: Secondary | ICD-10-CM | POA: Diagnosis not present

## 2020-10-21 DIAGNOSIS — I5020 Unspecified systolic (congestive) heart failure: Secondary | ICD-10-CM | POA: Diagnosis not present

## 2020-10-21 DIAGNOSIS — E782 Mixed hyperlipidemia: Secondary | ICD-10-CM | POA: Diagnosis not present

## 2020-10-21 DIAGNOSIS — I4891 Unspecified atrial fibrillation: Secondary | ICD-10-CM | POA: Diagnosis not present

## 2020-10-21 DIAGNOSIS — I4892 Unspecified atrial flutter: Secondary | ICD-10-CM | POA: Diagnosis not present

## 2020-12-02 DIAGNOSIS — L97509 Non-pressure chronic ulcer of other part of unspecified foot with unspecified severity: Secondary | ICD-10-CM | POA: Diagnosis not present

## 2020-12-02 DIAGNOSIS — U071 COVID-19: Secondary | ICD-10-CM | POA: Diagnosis not present

## 2020-12-02 DIAGNOSIS — R5383 Other fatigue: Secondary | ICD-10-CM | POA: Diagnosis not present

## 2020-12-02 DIAGNOSIS — M545 Low back pain, unspecified: Secondary | ICD-10-CM | POA: Diagnosis not present

## 2020-12-17 DIAGNOSIS — Z6827 Body mass index (BMI) 27.0-27.9, adult: Secondary | ICD-10-CM | POA: Diagnosis not present

## 2020-12-17 DIAGNOSIS — T300 Burn of unspecified body region, unspecified degree: Secondary | ICD-10-CM | POA: Diagnosis not present

## 2020-12-22 ENCOUNTER — Ambulatory Visit (HOSPITAL_COMMUNITY): Payer: Medicare PPO | Admitting: Physical Therapy

## 2020-12-23 DIAGNOSIS — Z4502 Encounter for adjustment and management of automatic implantable cardiac defibrillator: Secondary | ICD-10-CM | POA: Diagnosis not present

## 2021-01-19 DIAGNOSIS — M6281 Muscle weakness (generalized): Secondary | ICD-10-CM | POA: Diagnosis not present

## 2021-01-19 DIAGNOSIS — R2689 Other abnormalities of gait and mobility: Secondary | ICD-10-CM | POA: Diagnosis not present

## 2021-01-20 DIAGNOSIS — I255 Ischemic cardiomyopathy: Secondary | ICD-10-CM | POA: Diagnosis not present

## 2021-01-20 DIAGNOSIS — I10 Essential (primary) hypertension: Secondary | ICD-10-CM | POA: Diagnosis not present

## 2021-01-20 DIAGNOSIS — I4892 Unspecified atrial flutter: Secondary | ICD-10-CM | POA: Diagnosis not present

## 2021-01-20 DIAGNOSIS — E782 Mixed hyperlipidemia: Secondary | ICD-10-CM | POA: Diagnosis not present

## 2021-01-20 DIAGNOSIS — I5020 Unspecified systolic (congestive) heart failure: Secondary | ICD-10-CM | POA: Diagnosis not present

## 2021-01-20 DIAGNOSIS — I4891 Unspecified atrial fibrillation: Secondary | ICD-10-CM | POA: Diagnosis not present

## 2021-01-20 DIAGNOSIS — I472 Ventricular tachycardia: Secondary | ICD-10-CM | POA: Diagnosis not present

## 2021-01-25 DIAGNOSIS — M6281 Muscle weakness (generalized): Secondary | ICD-10-CM | POA: Diagnosis not present

## 2021-01-25 DIAGNOSIS — R2689 Other abnormalities of gait and mobility: Secondary | ICD-10-CM | POA: Diagnosis not present

## 2021-01-27 DIAGNOSIS — M6281 Muscle weakness (generalized): Secondary | ICD-10-CM | POA: Diagnosis not present

## 2021-01-27 DIAGNOSIS — R2689 Other abnormalities of gait and mobility: Secondary | ICD-10-CM | POA: Diagnosis not present

## 2021-02-01 DIAGNOSIS — R2689 Other abnormalities of gait and mobility: Secondary | ICD-10-CM | POA: Diagnosis not present

## 2021-02-01 DIAGNOSIS — M6281 Muscle weakness (generalized): Secondary | ICD-10-CM | POA: Diagnosis not present

## 2021-02-03 DIAGNOSIS — M6281 Muscle weakness (generalized): Secondary | ICD-10-CM | POA: Diagnosis not present

## 2021-02-03 DIAGNOSIS — R2689 Other abnormalities of gait and mobility: Secondary | ICD-10-CM | POA: Diagnosis not present

## 2021-02-08 DIAGNOSIS — R2689 Other abnormalities of gait and mobility: Secondary | ICD-10-CM | POA: Diagnosis not present

## 2021-02-08 DIAGNOSIS — M6281 Muscle weakness (generalized): Secondary | ICD-10-CM | POA: Diagnosis not present

## 2021-02-10 DIAGNOSIS — M6281 Muscle weakness (generalized): Secondary | ICD-10-CM | POA: Diagnosis not present

## 2021-02-10 DIAGNOSIS — R2689 Other abnormalities of gait and mobility: Secondary | ICD-10-CM | POA: Diagnosis not present

## 2021-02-15 DIAGNOSIS — M6281 Muscle weakness (generalized): Secondary | ICD-10-CM | POA: Diagnosis not present

## 2021-02-15 DIAGNOSIS — R2689 Other abnormalities of gait and mobility: Secondary | ICD-10-CM | POA: Diagnosis not present

## 2021-02-17 DIAGNOSIS — M6281 Muscle weakness (generalized): Secondary | ICD-10-CM | POA: Diagnosis not present

## 2021-02-17 DIAGNOSIS — R2689 Other abnormalities of gait and mobility: Secondary | ICD-10-CM | POA: Diagnosis not present

## 2021-02-22 DIAGNOSIS — M6281 Muscle weakness (generalized): Secondary | ICD-10-CM | POA: Diagnosis not present

## 2021-02-22 DIAGNOSIS — R2689 Other abnormalities of gait and mobility: Secondary | ICD-10-CM | POA: Diagnosis not present

## 2021-03-01 DIAGNOSIS — R2689 Other abnormalities of gait and mobility: Secondary | ICD-10-CM | POA: Diagnosis not present

## 2021-03-01 DIAGNOSIS — M6281 Muscle weakness (generalized): Secondary | ICD-10-CM | POA: Diagnosis not present

## 2021-03-03 DIAGNOSIS — A419 Sepsis, unspecified organism: Secondary | ICD-10-CM | POA: Diagnosis not present

## 2021-03-03 DIAGNOSIS — I5042 Chronic combined systolic (congestive) and diastolic (congestive) heart failure: Secondary | ICD-10-CM | POA: Diagnosis not present

## 2021-03-03 DIAGNOSIS — R531 Weakness: Secondary | ICD-10-CM | POA: Diagnosis not present

## 2021-03-03 DIAGNOSIS — I252 Old myocardial infarction: Secondary | ICD-10-CM | POA: Diagnosis not present

## 2021-03-03 DIAGNOSIS — I959 Hypotension, unspecified: Secondary | ICD-10-CM | POA: Diagnosis not present

## 2021-03-03 DIAGNOSIS — N183 Chronic kidney disease, stage 3 unspecified: Secondary | ICD-10-CM | POA: Diagnosis not present

## 2021-03-03 DIAGNOSIS — L03115 Cellulitis of right lower limb: Secondary | ICD-10-CM | POA: Diagnosis not present

## 2021-03-03 DIAGNOSIS — Z8679 Personal history of other diseases of the circulatory system: Secondary | ICD-10-CM | POA: Diagnosis not present

## 2021-03-03 DIAGNOSIS — R059 Cough, unspecified: Secondary | ICD-10-CM | POA: Diagnosis not present

## 2021-03-03 DIAGNOSIS — E860 Dehydration: Secondary | ICD-10-CM | POA: Diagnosis not present

## 2021-03-03 DIAGNOSIS — R509 Fever, unspecified: Secondary | ICD-10-CM | POA: Diagnosis not present

## 2021-03-03 DIAGNOSIS — N390 Urinary tract infection, site not specified: Secondary | ICD-10-CM | POA: Diagnosis not present

## 2021-03-03 DIAGNOSIS — Z20822 Contact with and (suspected) exposure to covid-19: Secondary | ICD-10-CM | POA: Diagnosis not present

## 2021-03-03 DIAGNOSIS — N179 Acute kidney failure, unspecified: Secondary | ICD-10-CM | POA: Diagnosis not present

## 2021-03-04 DIAGNOSIS — N390 Urinary tract infection, site not specified: Secondary | ICD-10-CM | POA: Diagnosis not present

## 2021-03-04 DIAGNOSIS — R531 Weakness: Secondary | ICD-10-CM | POA: Diagnosis not present

## 2021-03-04 DIAGNOSIS — N179 Acute kidney failure, unspecified: Secondary | ICD-10-CM | POA: Diagnosis not present

## 2021-03-04 DIAGNOSIS — I5042 Chronic combined systolic (congestive) and diastolic (congestive) heart failure: Secondary | ICD-10-CM | POA: Diagnosis not present

## 2021-03-05 DIAGNOSIS — I5040 Unspecified combined systolic (congestive) and diastolic (congestive) heart failure: Secondary | ICD-10-CM | POA: Diagnosis not present

## 2021-03-05 DIAGNOSIS — R531 Weakness: Secondary | ICD-10-CM | POA: Diagnosis not present

## 2021-03-05 DIAGNOSIS — N390 Urinary tract infection, site not specified: Secondary | ICD-10-CM | POA: Diagnosis not present

## 2021-03-05 DIAGNOSIS — B9620 Unspecified Escherichia coli [E. coli] as the cause of diseases classified elsewhere: Secondary | ICD-10-CM | POA: Diagnosis not present

## 2021-03-05 DIAGNOSIS — E860 Dehydration: Secondary | ICD-10-CM | POA: Diagnosis not present

## 2021-03-05 DIAGNOSIS — N179 Acute kidney failure, unspecified: Secondary | ICD-10-CM | POA: Diagnosis not present

## 2021-03-06 DIAGNOSIS — E860 Dehydration: Secondary | ICD-10-CM | POA: Diagnosis not present

## 2021-03-06 DIAGNOSIS — N179 Acute kidney failure, unspecified: Secondary | ICD-10-CM | POA: Diagnosis not present

## 2021-03-06 DIAGNOSIS — N390 Urinary tract infection, site not specified: Secondary | ICD-10-CM | POA: Diagnosis not present

## 2021-03-06 DIAGNOSIS — I5020 Unspecified systolic (congestive) heart failure: Secondary | ICD-10-CM | POA: Diagnosis not present

## 2021-03-06 DIAGNOSIS — B9620 Unspecified Escherichia coli [E. coli] as the cause of diseases classified elsewhere: Secondary | ICD-10-CM | POA: Diagnosis not present

## 2021-03-06 DIAGNOSIS — A419 Sepsis, unspecified organism: Secondary | ICD-10-CM | POA: Diagnosis not present

## 2021-03-06 DIAGNOSIS — R531 Weakness: Secondary | ICD-10-CM | POA: Diagnosis not present

## 2021-03-06 DIAGNOSIS — L03115 Cellulitis of right lower limb: Secondary | ICD-10-CM | POA: Diagnosis not present

## 2021-03-07 DIAGNOSIS — I10 Essential (primary) hypertension: Secondary | ICD-10-CM | POA: Diagnosis not present

## 2021-03-07 DIAGNOSIS — E782 Mixed hyperlipidemia: Secondary | ICD-10-CM | POA: Diagnosis not present

## 2021-03-07 DIAGNOSIS — E7849 Other hyperlipidemia: Secondary | ICD-10-CM | POA: Diagnosis not present

## 2021-03-07 DIAGNOSIS — N189 Chronic kidney disease, unspecified: Secondary | ICD-10-CM | POA: Diagnosis not present

## 2021-03-07 DIAGNOSIS — R5383 Other fatigue: Secondary | ICD-10-CM | POA: Diagnosis not present

## 2021-03-07 DIAGNOSIS — E559 Vitamin D deficiency, unspecified: Secondary | ICD-10-CM | POA: Diagnosis not present

## 2021-03-07 DIAGNOSIS — R739 Hyperglycemia, unspecified: Secondary | ICD-10-CM | POA: Diagnosis not present

## 2021-03-15 DIAGNOSIS — M6281 Muscle weakness (generalized): Secondary | ICD-10-CM | POA: Diagnosis not present

## 2021-03-15 DIAGNOSIS — R2689 Other abnormalities of gait and mobility: Secondary | ICD-10-CM | POA: Diagnosis not present

## 2021-03-15 DIAGNOSIS — Z4502 Encounter for adjustment and management of automatic implantable cardiac defibrillator: Secondary | ICD-10-CM | POA: Diagnosis not present

## 2021-04-05 DIAGNOSIS — I509 Heart failure, unspecified: Secondary | ICD-10-CM | POA: Diagnosis not present

## 2021-04-05 DIAGNOSIS — B9620 Unspecified Escherichia coli [E. coli] as the cause of diseases classified elsewhere: Secondary | ICD-10-CM | POA: Diagnosis not present

## 2021-04-05 DIAGNOSIS — N183 Chronic kidney disease, stage 3 unspecified: Secondary | ICD-10-CM | POA: Diagnosis not present

## 2021-04-05 DIAGNOSIS — R609 Edema, unspecified: Secondary | ICD-10-CM | POA: Diagnosis not present

## 2021-04-05 DIAGNOSIS — L0390 Cellulitis, unspecified: Secondary | ICD-10-CM | POA: Diagnosis not present

## 2021-04-05 DIAGNOSIS — N390 Urinary tract infection, site not specified: Secondary | ICD-10-CM | POA: Diagnosis not present

## 2021-04-05 DIAGNOSIS — I4891 Unspecified atrial fibrillation: Secondary | ICD-10-CM | POA: Diagnosis not present

## 2021-05-04 DIAGNOSIS — I255 Ischemic cardiomyopathy: Secondary | ICD-10-CM | POA: Diagnosis not present

## 2021-05-04 DIAGNOSIS — I4891 Unspecified atrial fibrillation: Secondary | ICD-10-CM | POA: Diagnosis not present

## 2021-05-04 DIAGNOSIS — I472 Ventricular tachycardia, unspecified: Secondary | ICD-10-CM | POA: Diagnosis not present

## 2021-05-04 DIAGNOSIS — N189 Chronic kidney disease, unspecified: Secondary | ICD-10-CM | POA: Diagnosis not present

## 2021-05-04 DIAGNOSIS — E785 Hyperlipidemia, unspecified: Secondary | ICD-10-CM | POA: Diagnosis not present

## 2021-05-04 DIAGNOSIS — I4892 Unspecified atrial flutter: Secondary | ICD-10-CM | POA: Diagnosis not present

## 2021-05-04 DIAGNOSIS — R251 Tremor, unspecified: Secondary | ICD-10-CM | POA: Diagnosis not present

## 2021-05-04 DIAGNOSIS — I5022 Chronic systolic (congestive) heart failure: Secondary | ICD-10-CM | POA: Diagnosis not present

## 2021-05-04 DIAGNOSIS — F17210 Nicotine dependence, cigarettes, uncomplicated: Secondary | ICD-10-CM | POA: Diagnosis not present

## 2021-05-31 DIAGNOSIS — Z01110 Encounter for hearing examination following failed hearing screening: Secondary | ICD-10-CM | POA: Diagnosis not present

## 2021-06-10 DIAGNOSIS — I10 Essential (primary) hypertension: Secondary | ICD-10-CM | POA: Diagnosis not present

## 2021-06-10 DIAGNOSIS — E559 Vitamin D deficiency, unspecified: Secondary | ICD-10-CM | POA: Diagnosis not present

## 2021-06-10 DIAGNOSIS — E7801 Familial hypercholesterolemia: Secondary | ICD-10-CM | POA: Diagnosis not present

## 2021-06-10 DIAGNOSIS — E7800 Pure hypercholesterolemia, unspecified: Secondary | ICD-10-CM | POA: Diagnosis not present

## 2021-06-10 DIAGNOSIS — E782 Mixed hyperlipidemia: Secondary | ICD-10-CM | POA: Diagnosis not present

## 2021-06-10 DIAGNOSIS — E7849 Other hyperlipidemia: Secondary | ICD-10-CM | POA: Diagnosis not present

## 2021-06-10 DIAGNOSIS — R5383 Other fatigue: Secondary | ICD-10-CM | POA: Diagnosis not present

## 2021-06-10 DIAGNOSIS — N183 Chronic kidney disease, stage 3 unspecified: Secondary | ICD-10-CM | POA: Diagnosis not present

## 2021-06-13 DIAGNOSIS — N401 Enlarged prostate with lower urinary tract symptoms: Secondary | ICD-10-CM | POA: Diagnosis not present

## 2021-06-13 DIAGNOSIS — F4321 Adjustment disorder with depressed mood: Secondary | ICD-10-CM | POA: Diagnosis not present

## 2021-06-13 DIAGNOSIS — I4891 Unspecified atrial fibrillation: Secondary | ICD-10-CM | POA: Diagnosis not present

## 2021-06-13 DIAGNOSIS — R609 Edema, unspecified: Secondary | ICD-10-CM | POA: Diagnosis not present

## 2021-06-13 DIAGNOSIS — R338 Other retention of urine: Secondary | ICD-10-CM | POA: Diagnosis not present

## 2021-06-13 DIAGNOSIS — I509 Heart failure, unspecified: Secondary | ICD-10-CM | POA: Diagnosis not present

## 2021-06-13 DIAGNOSIS — I2510 Atherosclerotic heart disease of native coronary artery without angina pectoris: Secondary | ICD-10-CM | POA: Diagnosis not present

## 2021-06-13 DIAGNOSIS — E559 Vitamin D deficiency, unspecified: Secondary | ICD-10-CM | POA: Diagnosis not present

## 2021-07-18 DIAGNOSIS — Z4502 Encounter for adjustment and management of automatic implantable cardiac defibrillator: Secondary | ICD-10-CM | POA: Diagnosis not present

## 2021-07-21 DIAGNOSIS — I5020 Unspecified systolic (congestive) heart failure: Secondary | ICD-10-CM | POA: Diagnosis not present

## 2021-07-21 DIAGNOSIS — I4892 Unspecified atrial flutter: Secondary | ICD-10-CM | POA: Diagnosis not present

## 2021-07-21 DIAGNOSIS — I10 Essential (primary) hypertension: Secondary | ICD-10-CM | POA: Diagnosis not present

## 2021-07-21 DIAGNOSIS — I4891 Unspecified atrial fibrillation: Secondary | ICD-10-CM | POA: Diagnosis not present

## 2021-07-21 DIAGNOSIS — E782 Mixed hyperlipidemia: Secondary | ICD-10-CM | POA: Diagnosis not present

## 2021-07-26 ENCOUNTER — Other Ambulatory Visit: Payer: Self-pay | Admitting: Urology

## 2021-08-11 DIAGNOSIS — L98499 Non-pressure chronic ulcer of skin of other sites with unspecified severity: Secondary | ICD-10-CM | POA: Diagnosis not present

## 2021-08-11 DIAGNOSIS — Z6826 Body mass index (BMI) 26.0-26.9, adult: Secondary | ICD-10-CM | POA: Diagnosis not present

## 2021-08-24 DIAGNOSIS — L98499 Non-pressure chronic ulcer of skin of other sites with unspecified severity: Secondary | ICD-10-CM | POA: Diagnosis not present

## 2021-08-24 DIAGNOSIS — Z6826 Body mass index (BMI) 26.0-26.9, adult: Secondary | ICD-10-CM | POA: Diagnosis not present

## 2021-08-30 DIAGNOSIS — S0180XD Unspecified open wound of other part of head, subsequent encounter: Secondary | ICD-10-CM | POA: Diagnosis not present

## 2021-10-05 DIAGNOSIS — I10 Essential (primary) hypertension: Secondary | ICD-10-CM | POA: Diagnosis not present

## 2021-10-05 DIAGNOSIS — E7849 Other hyperlipidemia: Secondary | ICD-10-CM | POA: Diagnosis not present

## 2021-10-05 DIAGNOSIS — E782 Mixed hyperlipidemia: Secondary | ICD-10-CM | POA: Diagnosis not present

## 2021-10-05 DIAGNOSIS — N183 Chronic kidney disease, stage 3 unspecified: Secondary | ICD-10-CM | POA: Diagnosis not present

## 2021-10-05 DIAGNOSIS — E559 Vitamin D deficiency, unspecified: Secondary | ICD-10-CM | POA: Diagnosis not present

## 2021-10-11 DIAGNOSIS — I509 Heart failure, unspecified: Secondary | ICD-10-CM | POA: Diagnosis not present

## 2021-10-11 DIAGNOSIS — E7849 Other hyperlipidemia: Secondary | ICD-10-CM | POA: Diagnosis not present

## 2021-10-11 DIAGNOSIS — I2510 Atherosclerotic heart disease of native coronary artery without angina pectoris: Secondary | ICD-10-CM | POA: Diagnosis not present

## 2021-10-11 DIAGNOSIS — I4891 Unspecified atrial fibrillation: Secondary | ICD-10-CM | POA: Diagnosis not present

## 2021-10-11 DIAGNOSIS — R609 Edema, unspecified: Secondary | ICD-10-CM | POA: Diagnosis not present

## 2021-10-11 DIAGNOSIS — R7301 Impaired fasting glucose: Secondary | ICD-10-CM | POA: Diagnosis not present

## 2021-10-11 DIAGNOSIS — R338 Other retention of urine: Secondary | ICD-10-CM | POA: Diagnosis not present

## 2021-10-11 DIAGNOSIS — N183 Chronic kidney disease, stage 3 unspecified: Secondary | ICD-10-CM | POA: Diagnosis not present

## 2021-10-11 DIAGNOSIS — F4321 Adjustment disorder with depressed mood: Secondary | ICD-10-CM | POA: Diagnosis not present

## 2021-10-19 DIAGNOSIS — Z4502 Encounter for adjustment and management of automatic implantable cardiac defibrillator: Secondary | ICD-10-CM | POA: Diagnosis not present

## 2021-10-20 DIAGNOSIS — I472 Ventricular tachycardia, unspecified: Secondary | ICD-10-CM | POA: Diagnosis not present

## 2021-10-20 DIAGNOSIS — I4891 Unspecified atrial fibrillation: Secondary | ICD-10-CM | POA: Diagnosis not present

## 2021-10-20 DIAGNOSIS — I4892 Unspecified atrial flutter: Secondary | ICD-10-CM | POA: Diagnosis not present

## 2021-10-20 DIAGNOSIS — E782 Mixed hyperlipidemia: Secondary | ICD-10-CM | POA: Diagnosis not present

## 2021-10-20 DIAGNOSIS — I5020 Unspecified systolic (congestive) heart failure: Secondary | ICD-10-CM | POA: Diagnosis not present

## 2021-10-20 DIAGNOSIS — I10 Essential (primary) hypertension: Secondary | ICD-10-CM | POA: Diagnosis not present

## 2021-11-07 DIAGNOSIS — I5020 Unspecified systolic (congestive) heart failure: Secondary | ICD-10-CM | POA: Diagnosis not present

## 2021-11-07 DIAGNOSIS — I3481 Nonrheumatic mitral (valve) annulus calcification: Secondary | ICD-10-CM | POA: Diagnosis not present

## 2021-11-07 DIAGNOSIS — I340 Nonrheumatic mitral (valve) insufficiency: Secondary | ICD-10-CM | POA: Diagnosis not present

## 2022-01-18 DIAGNOSIS — Z4502 Encounter for adjustment and management of automatic implantable cardiac defibrillator: Secondary | ICD-10-CM | POA: Diagnosis not present

## 2022-01-24 DIAGNOSIS — I10 Essential (primary) hypertension: Secondary | ICD-10-CM | POA: Diagnosis not present

## 2022-01-24 DIAGNOSIS — E559 Vitamin D deficiency, unspecified: Secondary | ICD-10-CM | POA: Diagnosis not present

## 2022-01-24 DIAGNOSIS — E7800 Pure hypercholesterolemia, unspecified: Secondary | ICD-10-CM | POA: Diagnosis not present

## 2022-01-24 DIAGNOSIS — E782 Mixed hyperlipidemia: Secondary | ICD-10-CM | POA: Diagnosis not present

## 2022-01-24 DIAGNOSIS — N183 Chronic kidney disease, stage 3 unspecified: Secondary | ICD-10-CM | POA: Diagnosis not present

## 2022-01-26 DIAGNOSIS — I4892 Unspecified atrial flutter: Secondary | ICD-10-CM | POA: Diagnosis not present

## 2022-01-26 DIAGNOSIS — I4891 Unspecified atrial fibrillation: Secondary | ICD-10-CM | POA: Diagnosis not present

## 2022-01-26 DIAGNOSIS — I5020 Unspecified systolic (congestive) heart failure: Secondary | ICD-10-CM | POA: Diagnosis not present

## 2022-01-26 DIAGNOSIS — E782 Mixed hyperlipidemia: Secondary | ICD-10-CM | POA: Diagnosis not present

## 2022-01-31 DIAGNOSIS — R7301 Impaired fasting glucose: Secondary | ICD-10-CM | POA: Diagnosis not present

## 2022-01-31 DIAGNOSIS — R338 Other retention of urine: Secondary | ICD-10-CM | POA: Diagnosis not present

## 2022-01-31 DIAGNOSIS — N183 Chronic kidney disease, stage 3 unspecified: Secondary | ICD-10-CM | POA: Diagnosis not present

## 2022-01-31 DIAGNOSIS — I4891 Unspecified atrial fibrillation: Secondary | ICD-10-CM | POA: Diagnosis not present

## 2022-01-31 DIAGNOSIS — I2510 Atherosclerotic heart disease of native coronary artery without angina pectoris: Secondary | ICD-10-CM | POA: Diagnosis not present

## 2022-01-31 DIAGNOSIS — R609 Edema, unspecified: Secondary | ICD-10-CM | POA: Diagnosis not present

## 2022-01-31 DIAGNOSIS — G4700 Insomnia, unspecified: Secondary | ICD-10-CM | POA: Diagnosis not present

## 2022-01-31 DIAGNOSIS — F4321 Adjustment disorder with depressed mood: Secondary | ICD-10-CM | POA: Diagnosis not present

## 2022-01-31 DIAGNOSIS — I509 Heart failure, unspecified: Secondary | ICD-10-CM | POA: Diagnosis not present

## 2022-03-23 DIAGNOSIS — J40 Bronchitis, not specified as acute or chronic: Secondary | ICD-10-CM | POA: Diagnosis not present

## 2022-03-23 DIAGNOSIS — R030 Elevated blood-pressure reading, without diagnosis of hypertension: Secondary | ICD-10-CM | POA: Diagnosis not present

## 2022-03-23 DIAGNOSIS — Z6825 Body mass index (BMI) 25.0-25.9, adult: Secondary | ICD-10-CM | POA: Diagnosis not present

## 2022-03-23 DIAGNOSIS — Z20828 Contact with and (suspected) exposure to other viral communicable diseases: Secondary | ICD-10-CM | POA: Diagnosis not present

## 2022-03-23 DIAGNOSIS — J441 Chronic obstructive pulmonary disease with (acute) exacerbation: Secondary | ICD-10-CM | POA: Diagnosis not present

## 2022-04-20 DIAGNOSIS — Z4502 Encounter for adjustment and management of automatic implantable cardiac defibrillator: Secondary | ICD-10-CM | POA: Diagnosis not present

## 2022-04-20 DIAGNOSIS — Z95810 Presence of automatic (implantable) cardiac defibrillator: Secondary | ICD-10-CM | POA: Diagnosis not present

## 2022-05-24 DIAGNOSIS — N183 Chronic kidney disease, stage 3 unspecified: Secondary | ICD-10-CM | POA: Diagnosis not present

## 2022-05-24 DIAGNOSIS — I255 Ischemic cardiomyopathy: Secondary | ICD-10-CM | POA: Diagnosis not present

## 2022-05-24 DIAGNOSIS — R7301 Impaired fasting glucose: Secondary | ICD-10-CM | POA: Diagnosis not present

## 2022-05-24 DIAGNOSIS — Z720 Tobacco use: Secondary | ICD-10-CM | POA: Diagnosis not present

## 2022-05-24 DIAGNOSIS — E7849 Other hyperlipidemia: Secondary | ICD-10-CM | POA: Diagnosis not present

## 2022-05-24 DIAGNOSIS — E7801 Familial hypercholesterolemia: Secondary | ICD-10-CM | POA: Diagnosis not present

## 2022-05-24 DIAGNOSIS — I214 Non-ST elevation (NSTEMI) myocardial infarction: Secondary | ICD-10-CM | POA: Diagnosis not present

## 2022-05-24 DIAGNOSIS — E559 Vitamin D deficiency, unspecified: Secondary | ICD-10-CM | POA: Diagnosis not present

## 2022-05-24 DIAGNOSIS — E782 Mixed hyperlipidemia: Secondary | ICD-10-CM | POA: Diagnosis not present

## 2022-05-24 DIAGNOSIS — E7800 Pure hypercholesterolemia, unspecified: Secondary | ICD-10-CM | POA: Diagnosis not present

## 2022-05-24 DIAGNOSIS — I10 Essential (primary) hypertension: Secondary | ICD-10-CM | POA: Diagnosis not present

## 2022-05-31 DIAGNOSIS — R609 Edema, unspecified: Secondary | ICD-10-CM | POA: Diagnosis not present

## 2022-05-31 DIAGNOSIS — N401 Enlarged prostate with lower urinary tract symptoms: Secondary | ICD-10-CM | POA: Diagnosis not present

## 2022-05-31 DIAGNOSIS — R7301 Impaired fasting glucose: Secondary | ICD-10-CM | POA: Diagnosis not present

## 2022-05-31 DIAGNOSIS — F4321 Adjustment disorder with depressed mood: Secondary | ICD-10-CM | POA: Diagnosis not present

## 2022-05-31 DIAGNOSIS — I509 Heart failure, unspecified: Secondary | ICD-10-CM | POA: Diagnosis not present

## 2022-05-31 DIAGNOSIS — E7849 Other hyperlipidemia: Secondary | ICD-10-CM | POA: Diagnosis not present

## 2022-05-31 DIAGNOSIS — R338 Other retention of urine: Secondary | ICD-10-CM | POA: Diagnosis not present

## 2022-05-31 DIAGNOSIS — I4891 Unspecified atrial fibrillation: Secondary | ICD-10-CM | POA: Diagnosis not present

## 2022-05-31 DIAGNOSIS — I2510 Atherosclerotic heart disease of native coronary artery without angina pectoris: Secondary | ICD-10-CM | POA: Diagnosis not present

## 2022-07-20 DIAGNOSIS — Z4502 Encounter for adjustment and management of automatic implantable cardiac defibrillator: Secondary | ICD-10-CM | POA: Diagnosis not present

## 2022-07-20 DIAGNOSIS — Z95810 Presence of automatic (implantable) cardiac defibrillator: Secondary | ICD-10-CM | POA: Diagnosis not present

## 2022-08-01 DIAGNOSIS — I5020 Unspecified systolic (congestive) heart failure: Secondary | ICD-10-CM | POA: Diagnosis not present

## 2022-08-01 DIAGNOSIS — E782 Mixed hyperlipidemia: Secondary | ICD-10-CM | POA: Diagnosis not present

## 2022-08-01 DIAGNOSIS — I472 Ventricular tachycardia, unspecified: Secondary | ICD-10-CM | POA: Diagnosis not present

## 2022-08-01 DIAGNOSIS — I4891 Unspecified atrial fibrillation: Secondary | ICD-10-CM | POA: Diagnosis not present

## 2022-08-01 DIAGNOSIS — I10 Essential (primary) hypertension: Secondary | ICD-10-CM | POA: Diagnosis not present

## 2022-08-01 DIAGNOSIS — I4892 Unspecified atrial flutter: Secondary | ICD-10-CM | POA: Diagnosis not present

## 2022-08-17 DIAGNOSIS — R030 Elevated blood-pressure reading, without diagnosis of hypertension: Secondary | ICD-10-CM | POA: Diagnosis not present

## 2022-08-17 DIAGNOSIS — Z6825 Body mass index (BMI) 25.0-25.9, adult: Secondary | ICD-10-CM | POA: Diagnosis not present

## 2022-08-17 DIAGNOSIS — I509 Heart failure, unspecified: Secondary | ICD-10-CM | POA: Diagnosis not present

## 2022-08-17 DIAGNOSIS — I482 Chronic atrial fibrillation, unspecified: Secondary | ICD-10-CM | POA: Diagnosis not present

## 2022-08-17 DIAGNOSIS — I951 Orthostatic hypotension: Secondary | ICD-10-CM | POA: Diagnosis not present

## 2022-08-17 DIAGNOSIS — R5383 Other fatigue: Secondary | ICD-10-CM | POA: Diagnosis not present

## 2022-10-06 DIAGNOSIS — H353111 Nonexudative age-related macular degeneration, right eye, early dry stage: Secondary | ICD-10-CM | POA: Diagnosis not present

## 2022-10-06 DIAGNOSIS — H26493 Other secondary cataract, bilateral: Secondary | ICD-10-CM | POA: Diagnosis not present

## 2022-10-06 DIAGNOSIS — H1840 Unspecified corneal degeneration: Secondary | ICD-10-CM | POA: Diagnosis not present

## 2022-10-06 DIAGNOSIS — Z961 Presence of intraocular lens: Secondary | ICD-10-CM | POA: Diagnosis not present

## 2022-10-09 ENCOUNTER — Other Ambulatory Visit: Payer: Self-pay

## 2022-10-09 ENCOUNTER — Encounter (HOSPITAL_COMMUNITY): Payer: Self-pay | Admitting: Emergency Medicine

## 2022-10-09 ENCOUNTER — Emergency Department (HOSPITAL_COMMUNITY)
Admission: EM | Admit: 2022-10-09 | Discharge: 2022-10-10 | Disposition: A | Discharge status: Home / Self Care | Payer: Medicare PPO | Attending: Emergency Medicine | Admitting: Emergency Medicine

## 2022-10-09 DIAGNOSIS — N179 Acute kidney failure, unspecified: Secondary | ICD-10-CM | POA: Diagnosis not present

## 2022-10-09 DIAGNOSIS — Z7901 Long term (current) use of anticoagulants: Secondary | ICD-10-CM | POA: Diagnosis not present

## 2022-10-09 DIAGNOSIS — R339 Retention of urine, unspecified: Secondary | ICD-10-CM | POA: Diagnosis not present

## 2022-10-09 DIAGNOSIS — N3000 Acute cystitis without hematuria: Secondary | ICD-10-CM | POA: Diagnosis not present

## 2022-10-09 LAB — BASIC METABOLIC PANEL
Anion gap: 8 (ref 5–15)
BUN: 20 mg/dL (ref 8–23)
CO2: 27 mmol/L (ref 22–32)
Calcium: 8.4 mg/dL — ABNORMAL LOW (ref 8.9–10.3)
Chloride: 103 mmol/L (ref 98–111)
Creatinine, Ser: 1.8 mg/dL — ABNORMAL HIGH (ref 0.61–1.24)
GFR, Estimated: 38 mL/min — ABNORMAL LOW (ref >60)
Glucose, Bld: 118 mg/dL — ABNORMAL HIGH (ref 70–99)
Potassium: 4.5 mmol/L (ref 3.5–5.1)
Sodium: 138 mmol/L (ref 135–145)

## 2022-10-09 LAB — CBC WITH DIFFERENTIAL/PLATELET
Abs Immature Granulocytes: 0.1 10*3/uL — ABNORMAL HIGH (ref 0.00–0.07)
Basophils Absolute: 0 10*3/uL (ref 0.0–0.1)
Basophils Relative: 0 %
Eosinophils Absolute: 0 10*3/uL (ref 0.0–0.5)
Eosinophils Relative: 0 %
HCT: 43.9 % (ref 39.0–52.0)
Hemoglobin: 14.2 g/dL (ref 13.0–17.0)
Immature Granulocytes: 1 %
Lymphocytes Relative: 6 %
Lymphs Abs: 0.7 10*3/uL (ref 0.7–4.0)
MCH: 31.2 pg (ref 26.0–34.0)
MCHC: 32.3 g/dL (ref 30.0–36.0)
MCV: 96.5 fL (ref 80.0–100.0)
Monocytes Absolute: 0.9 10*3/uL (ref 0.1–1.0)
Monocytes Relative: 7 %
Neutro Abs: 10.6 10*3/uL — ABNORMAL HIGH (ref 1.7–7.7)
Neutrophils Relative %: 86 %
Platelets: 103 10*3/uL — ABNORMAL LOW (ref 150–400)
RBC: 4.55 MIL/uL (ref 4.22–5.81)
RDW: 14.1 % (ref 11.5–15.5)
WBC: 12.4 10*3/uL — ABNORMAL HIGH (ref 4.0–10.5)
nRBC: 0 % (ref 0.0–0.2)

## 2022-10-09 LAB — URINALYSIS, ROUTINE W REFLEX MICROSCOPIC
Bilirubin Urine: NEGATIVE
Glucose, UA: NEGATIVE mg/dL
Ketones, ur: NEGATIVE mg/dL
Nitrite: NEGATIVE
Protein, ur: 100 mg/dL — AB
Specific Gravity, Urine: 1.006 (ref 1.005–1.030)
WBC, UA: >50 WBC/hpf (ref 0–5)
pH: 6 (ref 5.0–8.0)

## 2022-10-09 NOTE — ED Notes (Signed)
Unable to scan any urine content in the bladder with bladder scanner. Technique was good, but scanner simply recorded no urine. Unable to visualize the bladder at all.

## 2022-10-09 NOTE — ED Triage Notes (Signed)
Pt via POV c/o urinary retention x 24 hours with prior hx same. No prostate issues and no known difficulty with catheter insertion. Bladder pain 8/10 and pt appears uncomfortable

## 2022-10-09 NOTE — ED Provider Notes (Signed)
[place] EMERGENCY DEPARTMENT AT [facility] Provider Note   CSN: [id number] Arrival date & time: 10/09/22  2129     History {Add pertinent medical, surgical, social history, OB history to HPI:1} Chief Complaint  Patient presents with   Urinary Retention    Harold English is a 82 y.o. male.  Patient is a 82 yo male with pmh of urinary retention in the past from BPH coming in for difficulty urinating. Patient admits to decreased urine production x 3-4 days with new suprapubic abdominal and back pain. Foley catheter placed in ED with 400cc output. Patient states he usually takes Flomax for his BPH but it was discontinued by his cardiologist due to hypotension.   The history is provided by the patient. No language interpreter was used.       Home Medications Prior to Admission medications   Medication Sig Start Date End Date Taking? Authorizing Provider  amiodarone (PACERONE) 200 MG tablet TAKE 1 TABLET BY MOUTH EVERY DAY 01/10/19   Azalee Course, PA  atorvastatin (LIPITOR) 40 MG tablet Take 1 tablet (40 mg total) by mouth at bedtime. 10/24/17   Azalee Course, PA  carvedilol (COREG) 6.25 MG tablet Take 1 tablet (6.25 mg total) by mouth 2 (two) times daily with a meal. 10/24/17   Lisabeth Devoid, George, PA  digoxin (LANOXIN) 0.125 MG tablet TAKE HALF A TABLET BY MOUTH DAILY. 01/15/19   Chilton Si, MD  ELIQUIS 5 MG TABS tablet TAKE 1 TABLET BY MOUTH TWICE A DAY 11/19/18   Lyn Records, MD  furosemide (LASIX) 40 MG tablet Take 40 mg by mouth daily.    [provider]  metoprolol succinate (TOPROL-XL) 25 MG 24 hr tablet Take 0.5 tablets by mouth daily. 06/24/20 07/24/20  [provider]  tamsulosin (FLOMAX) 0.4 MG CAPS capsule Take 0.4 mg by mouth 2 (two) times a day.    [provider]  tamsulosin (FLOMAX) 0.4 MG CAPS capsule TAKE 1 CAPSULE BY MOUTH EVERY DAY 07/28/21   McKenzie, Mardene Celeste, MD      Allergies    Sulfa antibiotics and Sulfonamide derivatives    Review  of Systems   Review of Systems  Constitutional:  Negative for chills and fever.  HENT:  Negative for ear pain and sore throat.   Eyes:  Negative for pain and visual disturbance.  Respiratory:  Negative for cough and shortness of breath.   Cardiovascular:  Negative for chest pain and palpitations.  Gastrointestinal:  Positive for abdominal pain. Negative for vomiting.  Genitourinary:  Positive for decreased urine volume and difficulty urinating. Negative for dysuria and hematuria.  Musculoskeletal:  Positive for back pain. Negative for arthralgias.  Skin:  Negative for color change and rash.  Neurological:  Negative for seizures and syncope.  All other systems reviewed and are negative.   Physical Exam Updated Vital Signs BP 131/75 (BP Location: Left Arm)   Pulse 80   Temp 98.2 F (36.8 C)   Resp 20   Ht 6\' 2"  (1.88 m)   Wt 90.7 kg   SpO2 98%   BMI 25.68 kg/m  Physical Exam Vitals and nursing note reviewed.  Constitutional:      General: He is not in acute distress.    Appearance: He is well-developed.  HENT:     Head: Normocephalic and atraumatic.  Eyes:     Conjunctiva/sclera: Conjunctivae normal.  Cardiovascular:     Rate and Rhythm: Normal rate and regular rhythm.  Heart sounds: No murmur heard. Pulmonary:     Effort: Pulmonary effort is normal. No respiratory distress.     Breath sounds: Normal breath sounds.  Abdominal:     Palpations: Abdomen is soft.     Tenderness: There is abdominal tenderness in the suprapubic area. There is no guarding or rebound.  Musculoskeletal:        General: No swelling.     Cervical back: Neck supple.  Skin:    General: Skin is warm and dry.     Capillary Refill: Capillary refill takes less than 2 seconds.  Neurological:     Mental Status: He is alert.  Psychiatric:        Mood and Affect: Mood normal.     ED Results / Procedures / Treatments   Labs (all labs ordered are listed, but only abnormal results are  displayed) Labs Reviewed  URINALYSIS, ROUTINE W REFLEX MICROSCOPIC  CBC WITH DIFFERENTIAL/PLATELET  BASIC METABOLIC PANEL    EKG None  Radiology No results found.  Procedures Procedures  {Document cardiac monitor, telemetry assessment procedure when appropriate:1}  Medications Ordered in ED Medications - No data to display  ED Course/ Medical Decision Making/ A&P   {   Click here for ABCD2, HEART and other calculatorsREFRESH Note before signing :1}                          Medical Decision Making Amount and/or Complexity of Data Reviewed Labs: ordered.   10:58 PM 82 yo male with pmh of urinary retention in the past from BPH coming in for difficulty urinating. Patient admits to decreased urine production x 3-4 days with new suprapubic abdominal and back pain. Foley catheter placed in ED with 400cc output. Patient states he usually takes Flomax for his BPH but it was discontinued by his cardiologist due to hypotension.   UA demonstrates urinary tract infection with greater than 50 white blood cells, large hemoglobin, large leukocytes.  Laboratory studies concerning for AKI and leukocytosis likely secondary to urinary tract infection.  Has seen Dr. Ronne Binning in the past.  Patient will be sent home with Foley catheter bag and recommending close follow-up.  {Document critical care time when appropriate:1} {Document review of labs and clinical decision tools ie heart score, Chads2Vasc2 etc:1}  {Document your independent review of radiology images, and any outside records:1} {Document your discussion with family members, caretakers, and with consultants:1} {Document social determinants of health affecting pt's care:1} {Document your decision making why or why not admission, treatments were needed:1} Final Clinical Impression(s) / ED Diagnoses Final diagnoses:  None    Rx / DC Orders ED Discharge Orders     None

## 2022-10-10 MED ORDER — CEPHALEXIN 500 MG PO CAPS: 500.0000 mg | ORAL_CAPSULE | Freq: Four times a day (QID) | ORAL | 0 refills | Status: AC

## 2022-10-10 MED ORDER — CEPHALEXIN 500 MG PO CAPS
500.0000 mg | ORAL_CAPSULE | Freq: Once | ORAL | Status: AC
  Administered 2022-10-10: 500 mg via ORAL
  Filled 2022-10-10: qty 1

## 2022-10-10 NOTE — Discharge Instructions (Addendum)
Please follow-up with your established urologist for acute urinary retention and urinary tract infection first thing tomorrow morning for an appointment at their soonest availability.

## 2022-10-11 LAB — URINE CULTURE: Culture: 60000 — AB

## 2022-10-12 LAB — URINE CULTURE

## 2022-10-13 ENCOUNTER — Telehealth (HOSPITAL_BASED_OUTPATIENT_CLINIC_OR_DEPARTMENT_OTHER): Payer: Self-pay

## 2022-10-13 NOTE — Telephone Encounter (Signed)
Post ED Visit - Positive Culture Follow-up  Culture report reviewed by antimicrobial stewardship pharmacist: [facility] [x]  Daylene Posey, Pharm.D. []  Celedonio Miyamoto, Pharm.D., BCPS AQ-ID []  Garvin Fila, Pharm.D., BCPS []  Georgina Pillion, Pharm.D., BCPS []  [place], [place].D., BCPS, AAHIVP []  Estella Husk, Pharm.D., BCPS, AAHIVP []  Lysle Pearl, PharmD, BCPS []  Phillips Climes, PharmD, BCPS []  Agapito Games, PharmD, BCPS []  Verlan Friends, PharmD []  Mervyn Gay, PharmD, BCPS []  Vinnie Level, PharmD  [facility] []  Len Childs, PharmD []  Greer Pickerel, PharmD []  Adalberto Cole, PharmD []  Perlie Gold, Rph []  Lonell Face) Jean Rosenthal, PharmD []  Earl Many, PharmD []  Junita Push, PharmD []  Dorna Leitz, PharmD []  Terrilee Files, PharmD []  Lynann Beaver, PharmD []  Keturah Barre, PharmD []  Loralee Pacas, PharmD []  Bernadene Person, PharmD   Positive urine culture Treated with Cephalexin, organism sensitive to the same and no further patient follow-up is required at this time.  Sandria Senter 10/13/2022, 10:23 AM

## 2022-10-16 DIAGNOSIS — E559 Vitamin D deficiency, unspecified: Secondary | ICD-10-CM | POA: Diagnosis not present

## 2022-10-16 DIAGNOSIS — R7301 Impaired fasting glucose: Secondary | ICD-10-CM | POA: Diagnosis not present

## 2022-10-16 DIAGNOSIS — I509 Heart failure, unspecified: Secondary | ICD-10-CM | POA: Diagnosis not present

## 2022-10-16 DIAGNOSIS — I10 Essential (primary) hypertension: Secondary | ICD-10-CM | POA: Diagnosis not present

## 2022-10-16 DIAGNOSIS — Z720 Tobacco use: Secondary | ICD-10-CM | POA: Diagnosis not present

## 2022-10-16 DIAGNOSIS — E782 Mixed hyperlipidemia: Secondary | ICD-10-CM | POA: Diagnosis not present

## 2022-10-16 DIAGNOSIS — E7849 Other hyperlipidemia: Secondary | ICD-10-CM | POA: Diagnosis not present

## 2022-10-18 DIAGNOSIS — I509 Heart failure, unspecified: Secondary | ICD-10-CM | POA: Diagnosis not present

## 2022-10-18 DIAGNOSIS — R7301 Impaired fasting glucose: Secondary | ICD-10-CM | POA: Diagnosis not present

## 2022-10-18 DIAGNOSIS — I4891 Unspecified atrial fibrillation: Secondary | ICD-10-CM | POA: Diagnosis not present

## 2022-10-18 DIAGNOSIS — N183 Chronic kidney disease, stage 3 unspecified: Secondary | ICD-10-CM | POA: Diagnosis not present

## 2022-10-18 DIAGNOSIS — E559 Vitamin D deficiency, unspecified: Secondary | ICD-10-CM | POA: Diagnosis not present

## 2022-10-18 DIAGNOSIS — N401 Enlarged prostate with lower urinary tract symptoms: Secondary | ICD-10-CM | POA: Diagnosis not present

## 2022-10-18 DIAGNOSIS — R609 Edema, unspecified: Secondary | ICD-10-CM | POA: Diagnosis not present

## 2022-10-18 DIAGNOSIS — I2510 Atherosclerotic heart disease of native coronary artery without angina pectoris: Secondary | ICD-10-CM | POA: Diagnosis not present

## 2022-10-18 DIAGNOSIS — F4321 Adjustment disorder with depressed mood: Secondary | ICD-10-CM | POA: Diagnosis not present

## 2022-10-19 DIAGNOSIS — Z95810 Presence of automatic (implantable) cardiac defibrillator: Secondary | ICD-10-CM | POA: Diagnosis not present

## 2022-10-19 DIAGNOSIS — Z4502 Encounter for adjustment and management of automatic implantable cardiac defibrillator: Secondary | ICD-10-CM | POA: Diagnosis not present

## 2022-10-30 ENCOUNTER — Ambulatory Visit: Payer: Medicare PPO | Admitting: Urology

## 2022-10-30 VITALS — BP 97/64 | HR 74

## 2022-10-30 DIAGNOSIS — R339 Retention of urine, unspecified: Secondary | ICD-10-CM

## 2022-10-30 DIAGNOSIS — N401 Enlarged prostate with lower urinary tract symptoms: Secondary | ICD-10-CM

## 2022-10-30 NOTE — Progress Notes (Signed)
Fill and Pull Catheter Removal  Patient is present today for a catheter removal.  Patient was cleaned and prepped in a sterile fashion of sterile water/ saline was instilled into the bladder when the patient felt the urge to urinate. 10ml of water was then drained from the balloon.  A 16FR foley cath was removed from the bladder no complications were noted .  Patient as then given some time to void on their own.  Patient can void  on their own after some time.  Patient tolerated well.  Performed by: Guss Bunde, CMA  Follow up/ Additional notes: MD to see after

## 2022-10-30 NOTE — Progress Notes (Unsigned)
10/30/2022 9:14 AM   Harold English [birth year] [id number]  Referring provider: Estanislado Pandy, MD [address] [place],  [place] [address]  Urinary retention   HPI: [name] is a 82yo here for evaluation of urinary retention. He was taken off his flomax due to hypotension and then developed urinary retention requiring foley replacement. Voiding trial passed today   PMH: Past Medical History:  Diagnosis Date   Atypical chest pain 09/16/2017   CAD (coronary artery disease)    Coronary artery disease involving coronary bypass graft of native heart with angina pectoris (HCC) 11/17/2009   Qualifier: Diagnosis of  By: Diona Browner, MD, Hollace Hayward    Essential hypertension, benign 11/17/2009   Qualifier: Diagnosis of  By: Diona Browner, MD, Hollace Hayward    GERD (gastroesophageal reflux disease)    Hyperlipidemia    Hypertension    Ischemic cardiomyopathy    MI (myocardial infarction) (HCC)    Mixed hyperlipidemia 11/17/2009   Qualifier: Diagnosis of  By: Diona Browner, MD, Hollace Hayward    Renal insufficiency    Typical atrial flutter (HCC) 09/21/2017   UNSPECIFIED SECONDARY CARDIOMYOPATHY 11/17/2009   Qualifier: Diagnosis of  By: Diona Browner, MD, Hollace Hayward    Ventricular tachyarrhythmia (HCC) 09/16/2017   Vertigo 09/21/2017    Surgical History: Past Surgical History:  Procedure Laterality Date   ADENOIDECTOMY     BACK SURGERY     CARDIAC SURGERY     CORONARY ARTERY BYPASS GRAFT     CORONARY STENT INTERVENTION N/A 09/18/2017   Procedure: CORONARY STENT INTERVENTION;  Surgeon: Tonny Bollman, MD;  Location: [facility] INVASIVE CV LAB;  Service: Cardiovascular;  Laterality: N/A;   CYSTOSCOPY WITH INSERTION OF UROLIFT N/A 10/07/2018   Procedure: CYSTOSCOPY WITH INSERTION OF UROLIFT;  Surgeon: Malen Gauze, MD;  Location: [facility];  Service: Urology;  Laterality: N/A;  30 MINS   LEFT HEART CATH AND CORS/GRAFTS ANGIOGRAPHY N/A 09/18/2017   Procedure: LEFT  HEART CATH AND CORS/GRAFTS ANGIOGRAPHY;  Surgeon: Tonny Bollman, MD;  Location: [facility] INVASIVE CV LAB;  Service: Cardiovascular;  Laterality: N/A;   TONSILLECTOMY      Home Medications:  Allergies as of 10/30/2022       Reactions   Sulfa Antibiotics    Sulfonamide Derivatives    Childhood         Medication List        Accurate as of October 30, 2022  9:14 AM. If you have any questions, ask your nurse or doctor.          amiodarone 200 MG tablet Commonly known as: PACERONE TAKE 1 TABLET BY MOUTH EVERY DAY   atorvastatin 40 MG tablet Commonly known as: LIPITOR Take 1 tablet (40 mg total) by mouth at bedtime.   carvedilol 6.25 MG tablet Commonly known as: COREG Take 1 tablet (6.25 mg total) by mouth 2 (two) times daily with a meal.   digoxin 0.125 MG tablet Commonly known as: LANOXIN TAKE HALF A TABLET BY MOUTH DAILY.   Eliquis 5 MG Tabs tablet Generic drug: apixaban TAKE 1 TABLET BY MOUTH TWICE A DAY   furosemide 40 MG tablet Commonly known as: LASIX Take 40 mg by mouth daily.   metoprolol succinate 25 MG 24 hr tablet Commonly known as: TOPROL-XL Take 0.5 tablets by mouth daily.   tamsulosin 0.4 MG Caps capsule Commonly known as: FLOMAX Take 0.4 mg by mouth 2 (two) times a day.   tamsulosin 0.4 MG  Caps capsule Commonly known as: FLOMAX TAKE 1 CAPSULE BY MOUTH EVERY DAY        Allergies:  Allergies  Allergen Reactions   Sulfa Antibiotics    Sulfonamide Derivatives     Childhood     Family History: Family History  Problem Relation Age of Onset   CAD Father     Social History:  reports that he quit smoking about 5 years ago. His smoking use included cigarettes. He smoked an average of 0.75 packs per day. He has never used smokeless tobacco. He reports current alcohol use. He reports that he does not use drugs.  ROS: All other review of systems were reviewed and are negative except what is noted above in HPI  Physical Exam: BP 97/64   Pulse 74    Constitutional:  Alert and oriented, No acute distress. HEENT: [place] AT, moist mucus membranes.  Trachea midline, no masses. Cardiovascular: No clubbing, cyanosis, or edema. Respiratory: Normal respiratory effort, no increased work of breathing. GI: Abdomen is soft, nontender, nondistended, no abdominal masses GU: No CVA tenderness.  Lymph: No cervical or inguinal lymphadenopathy. Skin: No rashes, bruises or suspicious lesions. Neurologic: Grossly intact, no focal deficits, moving all 4 extremities. Psychiatric: Normal mood and affect.  Laboratory Data: Lab Results  Component Value Date   WBC 12.4 (H) 10/09/2022   HGB 14.2 10/09/2022   HCT 43.9 10/09/2022   MCV 96.5 10/09/2022   PLT 103 (L) 10/09/2022    Lab Results  Component Value Date   CREATININE 1.80 (H) 10/09/2022    No results found for: "PSA"  No results found for: "TESTOSTERONE"  Lab Results  Component Value Date   HGBA1C 5.5 09/16/2017    Urinalysis    Component Value Date/Time   COLORURINE YELLOW 10/09/2022 2222   APPEARANCEUR CLOUDY (A) 10/09/2022 2222   LABSPEC 1.006 10/09/2022 2222   PHURINE 6.0 10/09/2022 2222   GLUCOSEU NEGATIVE 10/09/2022 2222   HGBUR LARGE (A) 10/09/2022 2222   BILIRUBINUR NEGATIVE 10/09/2022 2222   KETONESUR NEGATIVE 10/09/2022 2222   PROTEINUR 100 (A) 10/09/2022 2222   NITRITE NEGATIVE 10/09/2022 2222   LEUKOCYTESUR LARGE (A) 10/09/2022 2222    Lab Results  Component Value Date   BACTERIA FEW (A) 10/09/2022    Pertinent Imaging:  No results found for this or any previous visit.  No results found for this or any previous visit.  No results found for this or any previous visit.  No results found for this or any previous visit.  Results for orders placed during the hospital encounter of 09/16/17  US RENAL  Narrative CLINICAL DATA:  Chronic renal disease  EXAM: RENAL / URINARY TRACT ULTRASOUND COMPLETE  COMPARISON:  None.  FINDINGS: Right  Kidney:  Length: 10.3 cm. Echogenicity and renal cortical thickness are within normal limits. No perinephric fluid or hydronephrosis visualized. There is a cyst arising eccentrically from the lower pole left kidney measuring 4.8 x 4.4 x 3.7 cm. No sonographically demonstrable calculus or ureterectasis.  Left Kidney:  Length: 11.5 cm. Echogenicity and renal cortical thickness are within normal limits. No perinephric fluid or hydronephrosis visualized. There is a cyst arising from upper to mid left kidney measuring 1.4 x 1.3 x 1.3 cm. No sonographically demonstrable calculus or ureterectasis.  Bladder:  Appears normal for degree of bladder distention.  IMPRESSION: Cyst in each kidney, considerably larger on the right than on the left. Study otherwise unremarkable.   Electronically Signed By: Bretta Bang III M.D. On: 09/17/2017  08:39  No valid procedures specified. No results found for this or any previous visit.  No results found for this or any previous visit.   Assessment & Plan:    1. Urinary retention -followup 2 days for PVR   No follow-ups on file.  Wilkie Aye, MD  [facility] [place]

## 2022-10-31 ENCOUNTER — Ambulatory Visit: Payer: Medicare PPO

## 2022-10-31 ENCOUNTER — Encounter: Payer: Self-pay | Admitting: Urology

## 2022-10-31 NOTE — Patient Instructions (Signed)

## 2022-11-01 ENCOUNTER — Ambulatory Visit: Payer: Medicare PPO

## 2022-11-01 DIAGNOSIS — R339 Retention of urine, unspecified: Secondary | ICD-10-CM | POA: Diagnosis not present

## 2022-11-01 LAB — BLADDER SCAN AMB NON-IMAGING: Scan Result: 84

## 2022-11-01 NOTE — Progress Notes (Signed)
post void residual= 84mL ?

## 2022-11-07 DIAGNOSIS — I4891 Unspecified atrial fibrillation: Secondary | ICD-10-CM | POA: Diagnosis not present

## 2022-11-07 DIAGNOSIS — I4892 Unspecified atrial flutter: Secondary | ICD-10-CM | POA: Diagnosis not present

## 2022-11-07 DIAGNOSIS — I5020 Unspecified systolic (congestive) heart failure: Secondary | ICD-10-CM | POA: Diagnosis not present

## 2022-11-07 DIAGNOSIS — I255 Ischemic cardiomyopathy: Secondary | ICD-10-CM | POA: Diagnosis not present

## 2022-11-24 DIAGNOSIS — R300 Dysuria: Secondary | ICD-10-CM | POA: Diagnosis not present

## 2022-12-29 ENCOUNTER — Ambulatory Visit: Payer: Medicare PPO | Admitting: Urology

## 2023-01-18 DIAGNOSIS — Z95810 Presence of automatic (implantable) cardiac defibrillator: Secondary | ICD-10-CM | POA: Diagnosis not present

## 2023-01-18 DIAGNOSIS — Z4502 Encounter for adjustment and management of automatic implantable cardiac defibrillator: Secondary | ICD-10-CM | POA: Diagnosis not present

## 2023-02-08 DIAGNOSIS — N183 Chronic kidney disease, stage 3 unspecified: Secondary | ICD-10-CM | POA: Diagnosis not present

## 2023-02-08 DIAGNOSIS — Z79899 Other long term (current) drug therapy: Secondary | ICD-10-CM | POA: Diagnosis not present

## 2023-02-08 DIAGNOSIS — R5383 Other fatigue: Secondary | ICD-10-CM | POA: Diagnosis not present

## 2023-02-08 DIAGNOSIS — E559 Vitamin D deficiency, unspecified: Secondary | ICD-10-CM | POA: Diagnosis not present

## 2023-02-13 DIAGNOSIS — E559 Vitamin D deficiency, unspecified: Secondary | ICD-10-CM | POA: Diagnosis not present

## 2023-02-13 DIAGNOSIS — E7849 Other hyperlipidemia: Secondary | ICD-10-CM | POA: Diagnosis not present

## 2023-02-13 DIAGNOSIS — I2510 Atherosclerotic heart disease of native coronary artery without angina pectoris: Secondary | ICD-10-CM | POA: Diagnosis not present

## 2023-02-13 DIAGNOSIS — N1832 Chronic kidney disease, stage 3b: Secondary | ICD-10-CM | POA: Diagnosis not present

## 2023-02-13 DIAGNOSIS — R609 Edema, unspecified: Secondary | ICD-10-CM | POA: Diagnosis not present

## 2023-02-13 DIAGNOSIS — I509 Heart failure, unspecified: Secondary | ICD-10-CM | POA: Diagnosis not present

## 2023-02-13 DIAGNOSIS — F4321 Adjustment disorder with depressed mood: Secondary | ICD-10-CM | POA: Diagnosis not present

## 2023-02-13 DIAGNOSIS — N401 Enlarged prostate with lower urinary tract symptoms: Secondary | ICD-10-CM | POA: Diagnosis not present

## 2023-02-13 DIAGNOSIS — I4891 Unspecified atrial fibrillation: Secondary | ICD-10-CM | POA: Diagnosis not present

## 2023-02-14 DIAGNOSIS — I482 Chronic atrial fibrillation, unspecified: Secondary | ICD-10-CM | POA: Diagnosis not present

## 2023-02-14 DIAGNOSIS — I4891 Unspecified atrial fibrillation: Secondary | ICD-10-CM | POA: Diagnosis not present

## 2023-02-14 DIAGNOSIS — Z0001 Encounter for general adult medical examination with abnormal findings: Secondary | ICD-10-CM | POA: Diagnosis not present

## 2023-02-14 DIAGNOSIS — I10 Essential (primary) hypertension: Secondary | ICD-10-CM | POA: Diagnosis not present

## 2023-02-14 DIAGNOSIS — E7849 Other hyperlipidemia: Secondary | ICD-10-CM | POA: Diagnosis not present

## 2023-02-14 DIAGNOSIS — I509 Heart failure, unspecified: Secondary | ICD-10-CM | POA: Diagnosis not present

## 2023-02-14 DIAGNOSIS — J441 Chronic obstructive pulmonary disease with (acute) exacerbation: Secondary | ICD-10-CM | POA: Diagnosis not present

## 2023-02-14 DIAGNOSIS — N183 Chronic kidney disease, stage 3 unspecified: Secondary | ICD-10-CM | POA: Diagnosis not present

## 2023-04-19 DIAGNOSIS — Z95810 Presence of automatic (implantable) cardiac defibrillator: Secondary | ICD-10-CM | POA: Diagnosis not present

## 2023-04-19 DIAGNOSIS — Z4502 Encounter for adjustment and management of automatic implantable cardiac defibrillator: Secondary | ICD-10-CM | POA: Diagnosis not present

## 2023-04-24 DIAGNOSIS — R339 Retention of urine, unspecified: Secondary | ICD-10-CM | POA: Diagnosis not present

## 2023-04-24 DIAGNOSIS — E782 Mixed hyperlipidemia: Secondary | ICD-10-CM | POA: Diagnosis not present

## 2023-04-24 DIAGNOSIS — I4891 Unspecified atrial fibrillation: Secondary | ICD-10-CM | POA: Diagnosis not present

## 2023-05-09 DIAGNOSIS — I5032 Chronic diastolic (congestive) heart failure: Secondary | ICD-10-CM | POA: Diagnosis not present

## 2023-05-09 DIAGNOSIS — I480 Paroxysmal atrial fibrillation: Secondary | ICD-10-CM | POA: Diagnosis not present

## 2023-05-09 DIAGNOSIS — N183 Chronic kidney disease, stage 3 unspecified: Secondary | ICD-10-CM | POA: Diagnosis not present

## 2023-05-09 DIAGNOSIS — Z8679 Personal history of other diseases of the circulatory system: Secondary | ICD-10-CM | POA: Diagnosis not present

## 2023-06-11 DIAGNOSIS — E7849 Other hyperlipidemia: Secondary | ICD-10-CM | POA: Diagnosis not present

## 2023-06-11 DIAGNOSIS — Z5181 Encounter for therapeutic drug level monitoring: Secondary | ICD-10-CM | POA: Diagnosis not present

## 2023-06-11 DIAGNOSIS — E782 Mixed hyperlipidemia: Secondary | ICD-10-CM | POA: Diagnosis not present

## 2023-06-11 DIAGNOSIS — E559 Vitamin D deficiency, unspecified: Secondary | ICD-10-CM | POA: Diagnosis not present

## 2023-06-11 DIAGNOSIS — R5383 Other fatigue: Secondary | ICD-10-CM | POA: Diagnosis not present

## 2023-06-11 DIAGNOSIS — N1832 Chronic kidney disease, stage 3b: Secondary | ICD-10-CM | POA: Diagnosis not present

## 2023-06-18 DIAGNOSIS — R739 Hyperglycemia, unspecified: Secondary | ICD-10-CM | POA: Diagnosis not present

## 2023-06-18 DIAGNOSIS — E6609 Other obesity due to excess calories: Secondary | ICD-10-CM | POA: Diagnosis not present

## 2023-06-18 DIAGNOSIS — E782 Mixed hyperlipidemia: Secondary | ICD-10-CM | POA: Diagnosis not present

## 2023-06-18 DIAGNOSIS — Z6826 Body mass index (BMI) 26.0-26.9, adult: Secondary | ICD-10-CM | POA: Diagnosis not present

## 2023-06-18 DIAGNOSIS — N1832 Chronic kidney disease, stage 3b: Secondary | ICD-10-CM | POA: Diagnosis not present

## 2023-07-19 DIAGNOSIS — Z4502 Encounter for adjustment and management of automatic implantable cardiac defibrillator: Secondary | ICD-10-CM | POA: Diagnosis not present

## 2023-07-19 DIAGNOSIS — Z95810 Presence of automatic (implantable) cardiac defibrillator: Secondary | ICD-10-CM | POA: Diagnosis not present

## 2023-08-15 DIAGNOSIS — Z20828 Contact with and (suspected) exposure to other viral communicable diseases: Secondary | ICD-10-CM | POA: Diagnosis not present

## 2023-08-15 DIAGNOSIS — I110 Hypertensive heart disease with heart failure: Secondary | ICD-10-CM | POA: Diagnosis not present

## 2023-08-15 DIAGNOSIS — F17210 Nicotine dependence, cigarettes, uncomplicated: Secondary | ICD-10-CM | POA: Diagnosis not present

## 2023-08-15 DIAGNOSIS — I509 Heart failure, unspecified: Secondary | ICD-10-CM | POA: Diagnosis not present

## 2023-08-15 DIAGNOSIS — D72829 Elevated white blood cell count, unspecified: Secondary | ICD-10-CM | POA: Diagnosis not present

## 2023-08-15 DIAGNOSIS — E7800 Pure hypercholesterolemia, unspecified: Secondary | ICD-10-CM | POA: Diagnosis not present

## 2023-08-15 DIAGNOSIS — I2510 Atherosclerotic heart disease of native coronary artery without angina pectoris: Secondary | ICD-10-CM | POA: Diagnosis not present

## 2023-08-15 DIAGNOSIS — R0602 Shortness of breath: Secondary | ICD-10-CM | POA: Diagnosis not present

## 2023-08-15 DIAGNOSIS — N189 Chronic kidney disease, unspecified: Secondary | ICD-10-CM | POA: Diagnosis not present

## 2023-08-15 DIAGNOSIS — Z2089 Contact with and (suspected) exposure to other communicable diseases: Secondary | ICD-10-CM | POA: Diagnosis not present

## 2023-08-15 DIAGNOSIS — R059 Cough, unspecified: Secondary | ICD-10-CM | POA: Diagnosis not present

## 2023-08-15 DIAGNOSIS — R531 Weakness: Secondary | ICD-10-CM | POA: Diagnosis not present

## 2023-08-15 DIAGNOSIS — R058 Other specified cough: Secondary | ICD-10-CM | POA: Diagnosis not present

## 2023-08-15 DIAGNOSIS — B349 Viral infection, unspecified: Secondary | ICD-10-CM | POA: Diagnosis not present

## 2023-08-15 DIAGNOSIS — I959 Hypotension, unspecified: Secondary | ICD-10-CM | POA: Diagnosis not present

## 2023-08-15 DIAGNOSIS — R051 Acute cough: Secondary | ICD-10-CM | POA: Diagnosis not present

## 2023-08-22 DIAGNOSIS — E782 Mixed hyperlipidemia: Secondary | ICD-10-CM | POA: Diagnosis not present

## 2023-08-22 DIAGNOSIS — I482 Chronic atrial fibrillation, unspecified: Secondary | ICD-10-CM | POA: Diagnosis not present

## 2023-08-22 DIAGNOSIS — I951 Orthostatic hypotension: Secondary | ICD-10-CM | POA: Diagnosis not present

## 2023-08-27 DIAGNOSIS — I10 Essential (primary) hypertension: Secondary | ICD-10-CM | POA: Diagnosis not present

## 2023-08-27 DIAGNOSIS — R5383 Other fatigue: Secondary | ICD-10-CM | POA: Diagnosis not present

## 2023-08-27 DIAGNOSIS — R233 Spontaneous ecchymoses: Secondary | ICD-10-CM | POA: Diagnosis not present

## 2023-08-27 DIAGNOSIS — Z6825 Body mass index (BMI) 25.0-25.9, adult: Secondary | ICD-10-CM | POA: Diagnosis not present

## 2023-08-27 DIAGNOSIS — I959 Hypotension, unspecified: Secondary | ICD-10-CM | POA: Diagnosis not present

## 2023-09-21 DIAGNOSIS — E782 Mixed hyperlipidemia: Secondary | ICD-10-CM | POA: Diagnosis not present

## 2023-09-21 DIAGNOSIS — I951 Orthostatic hypotension: Secondary | ICD-10-CM | POA: Diagnosis not present

## 2023-09-21 DIAGNOSIS — I482 Chronic atrial fibrillation, unspecified: Secondary | ICD-10-CM | POA: Diagnosis not present

## 2023-10-18 DIAGNOSIS — I4892 Unspecified atrial flutter: Secondary | ICD-10-CM | POA: Diagnosis not present

## 2023-10-18 DIAGNOSIS — Z95810 Presence of automatic (implantable) cardiac defibrillator: Secondary | ICD-10-CM | POA: Diagnosis not present

## 2023-10-18 DIAGNOSIS — I472 Ventricular tachycardia, unspecified: Secondary | ICD-10-CM | POA: Diagnosis not present

## 2023-10-18 DIAGNOSIS — I4891 Unspecified atrial fibrillation: Secondary | ICD-10-CM | POA: Diagnosis not present

## 2023-10-18 DIAGNOSIS — Z4502 Encounter for adjustment and management of automatic implantable cardiac defibrillator: Secondary | ICD-10-CM | POA: Diagnosis not present

## 2023-10-19 DIAGNOSIS — Z4502 Encounter for adjustment and management of automatic implantable cardiac defibrillator: Secondary | ICD-10-CM | POA: Diagnosis not present

## 2023-10-19 DIAGNOSIS — Z95810 Presence of automatic (implantable) cardiac defibrillator: Secondary | ICD-10-CM | POA: Diagnosis not present

## 2023-10-19 DIAGNOSIS — I509 Heart failure, unspecified: Secondary | ICD-10-CM | POA: Diagnosis not present

## 2023-10-22 DIAGNOSIS — E782 Mixed hyperlipidemia: Secondary | ICD-10-CM | POA: Diagnosis not present

## 2023-10-22 DIAGNOSIS — I482 Chronic atrial fibrillation, unspecified: Secondary | ICD-10-CM | POA: Diagnosis not present

## 2023-10-22 DIAGNOSIS — I951 Orthostatic hypotension: Secondary | ICD-10-CM | POA: Diagnosis not present

## 2023-10-31 DIAGNOSIS — N183 Chronic kidney disease, stage 3 unspecified: Secondary | ICD-10-CM | POA: Diagnosis not present

## 2023-10-31 DIAGNOSIS — I959 Hypotension, unspecified: Secondary | ICD-10-CM | POA: Diagnosis not present

## 2023-10-31 DIAGNOSIS — I5022 Chronic systolic (congestive) heart failure: Secondary | ICD-10-CM | POA: Diagnosis not present

## 2023-10-31 DIAGNOSIS — Z8679 Personal history of other diseases of the circulatory system: Secondary | ICD-10-CM | POA: Diagnosis not present

## 2023-10-31 DIAGNOSIS — I480 Paroxysmal atrial fibrillation: Secondary | ICD-10-CM | POA: Diagnosis not present

## 2023-11-22 DIAGNOSIS — I951 Orthostatic hypotension: Secondary | ICD-10-CM | POA: Diagnosis not present

## 2023-11-22 DIAGNOSIS — E782 Mixed hyperlipidemia: Secondary | ICD-10-CM | POA: Diagnosis not present

## 2023-11-22 DIAGNOSIS — I482 Chronic atrial fibrillation, unspecified: Secondary | ICD-10-CM | POA: Diagnosis not present

## 2023-12-11 DIAGNOSIS — R739 Hyperglycemia, unspecified: Secondary | ICD-10-CM | POA: Diagnosis not present

## 2023-12-11 DIAGNOSIS — Z1329 Encounter for screening for other suspected endocrine disorder: Secondary | ICD-10-CM | POA: Diagnosis not present

## 2023-12-11 DIAGNOSIS — Z5181 Encounter for therapeutic drug level monitoring: Secondary | ICD-10-CM | POA: Diagnosis not present

## 2023-12-11 DIAGNOSIS — E7849 Other hyperlipidemia: Secondary | ICD-10-CM | POA: Diagnosis not present

## 2023-12-11 DIAGNOSIS — N1832 Chronic kidney disease, stage 3b: Secondary | ICD-10-CM | POA: Diagnosis not present

## 2023-12-11 DIAGNOSIS — D559 Anemia due to enzyme disorder, unspecified: Secondary | ICD-10-CM | POA: Diagnosis not present

## 2023-12-11 DIAGNOSIS — E559 Vitamin D deficiency, unspecified: Secondary | ICD-10-CM | POA: Diagnosis not present

## 2023-12-11 DIAGNOSIS — I482 Chronic atrial fibrillation, unspecified: Secondary | ICD-10-CM | POA: Diagnosis not present

## 2023-12-18 DIAGNOSIS — E782 Mixed hyperlipidemia: Secondary | ICD-10-CM | POA: Diagnosis not present

## 2023-12-18 DIAGNOSIS — Z6825 Body mass index (BMI) 25.0-25.9, adult: Secondary | ICD-10-CM | POA: Diagnosis not present

## 2023-12-18 DIAGNOSIS — I959 Hypotension, unspecified: Secondary | ICD-10-CM | POA: Diagnosis not present

## 2023-12-18 DIAGNOSIS — N401 Enlarged prostate with lower urinary tract symptoms: Secondary | ICD-10-CM | POA: Diagnosis not present

## 2023-12-18 DIAGNOSIS — I482 Chronic atrial fibrillation, unspecified: Secondary | ICD-10-CM | POA: Diagnosis not present

## 2023-12-21 DIAGNOSIS — E782 Mixed hyperlipidemia: Secondary | ICD-10-CM | POA: Diagnosis not present

## 2023-12-21 DIAGNOSIS — I482 Chronic atrial fibrillation, unspecified: Secondary | ICD-10-CM | POA: Diagnosis not present

## 2023-12-21 DIAGNOSIS — I951 Orthostatic hypotension: Secondary | ICD-10-CM | POA: Diagnosis not present

## 2024-01-09 DIAGNOSIS — I4891 Unspecified atrial fibrillation: Secondary | ICD-10-CM | POA: Diagnosis not present

## 2024-01-09 DIAGNOSIS — R42 Dizziness and giddiness: Secondary | ICD-10-CM | POA: Diagnosis not present

## 2024-01-09 DIAGNOSIS — I4892 Unspecified atrial flutter: Secondary | ICD-10-CM | POA: Diagnosis not present

## 2024-01-09 DIAGNOSIS — I5020 Unspecified systolic (congestive) heart failure: Secondary | ICD-10-CM | POA: Diagnosis not present

## 2024-01-09 DIAGNOSIS — Z4502 Encounter for adjustment and management of automatic implantable cardiac defibrillator: Secondary | ICD-10-CM | POA: Diagnosis not present

## 2024-01-09 DIAGNOSIS — Z79899 Other long term (current) drug therapy: Secondary | ICD-10-CM | POA: Diagnosis not present

## 2024-01-09 DIAGNOSIS — R609 Edema, unspecified: Secondary | ICD-10-CM | POA: Diagnosis not present

## 2024-01-09 DIAGNOSIS — R0600 Dyspnea, unspecified: Secondary | ICD-10-CM | POA: Diagnosis not present

## 2024-01-09 DIAGNOSIS — Z5181 Encounter for therapeutic drug level monitoring: Secondary | ICD-10-CM | POA: Diagnosis not present

## 2024-01-09 DIAGNOSIS — R5382 Chronic fatigue, unspecified: Secondary | ICD-10-CM | POA: Diagnosis not present

## 2024-01-10 DIAGNOSIS — Z79899 Other long term (current) drug therapy: Secondary | ICD-10-CM | POA: Diagnosis not present

## 2024-01-10 DIAGNOSIS — I5020 Unspecified systolic (congestive) heart failure: Secondary | ICD-10-CM | POA: Diagnosis not present

## 2024-01-10 DIAGNOSIS — Z5181 Encounter for therapeutic drug level monitoring: Secondary | ICD-10-CM | POA: Diagnosis not present

## 2024-01-10 DIAGNOSIS — Z4502 Encounter for adjustment and management of automatic implantable cardiac defibrillator: Secondary | ICD-10-CM | POA: Diagnosis not present

## 2024-01-17 DIAGNOSIS — Z95810 Presence of automatic (implantable) cardiac defibrillator: Secondary | ICD-10-CM | POA: Diagnosis not present

## 2024-01-18 DIAGNOSIS — Z95810 Presence of automatic (implantable) cardiac defibrillator: Secondary | ICD-10-CM | POA: Diagnosis not present

## 2024-01-18 DIAGNOSIS — I509 Heart failure, unspecified: Secondary | ICD-10-CM | POA: Diagnosis not present

## 2024-01-22 DIAGNOSIS — I951 Orthostatic hypotension: Secondary | ICD-10-CM | POA: Diagnosis not present

## 2024-01-22 DIAGNOSIS — I482 Chronic atrial fibrillation, unspecified: Secondary | ICD-10-CM | POA: Diagnosis not present

## 2024-01-22 DIAGNOSIS — E782 Mixed hyperlipidemia: Secondary | ICD-10-CM | POA: Diagnosis not present

## 2024-01-28 DIAGNOSIS — I4891 Unspecified atrial fibrillation: Secondary | ICD-10-CM | POA: Diagnosis not present

## 2024-01-28 DIAGNOSIS — I255 Ischemic cardiomyopathy: Secondary | ICD-10-CM | POA: Diagnosis not present

## 2024-01-28 DIAGNOSIS — I4892 Unspecified atrial flutter: Secondary | ICD-10-CM | POA: Diagnosis not present

## 2024-02-06 DIAGNOSIS — Z23 Encounter for immunization: Secondary | ICD-10-CM | POA: Diagnosis not present

## 2024-02-22 DIAGNOSIS — I951 Orthostatic hypotension: Secondary | ICD-10-CM | POA: Diagnosis not present

## 2024-02-22 DIAGNOSIS — E782 Mixed hyperlipidemia: Secondary | ICD-10-CM | POA: Diagnosis not present

## 2024-02-22 DIAGNOSIS — I482 Chronic atrial fibrillation, unspecified: Secondary | ICD-10-CM | POA: Diagnosis not present

## 2024-04-29 DIAGNOSIS — D099 Carcinoma in situ, unspecified: Secondary | ICD-10-CM

## 2024-04-29 HISTORY — DX: Carcinoma in situ, unspecified: D09.9

## 2024-05-12 ENCOUNTER — Encounter: Payer: Self-pay | Admitting: Dermatology

## 2024-05-14 ENCOUNTER — Encounter: Payer: Self-pay | Admitting: Dermatology

## 2024-05-15 ENCOUNTER — Ambulatory Visit: Admitting: Dermatology

## 2024-05-15 ENCOUNTER — Encounter: Payer: Self-pay | Admitting: Dermatology

## 2024-05-15 DIAGNOSIS — L814 Other melanin hyperpigmentation: Secondary | ICD-10-CM | POA: Diagnosis not present

## 2024-05-15 DIAGNOSIS — D044 Carcinoma in situ of skin of scalp and neck: Secondary | ICD-10-CM

## 2024-05-15 DIAGNOSIS — L578 Other skin changes due to chronic exposure to nonionizing radiation: Secondary | ICD-10-CM | POA: Diagnosis not present

## 2024-05-15 DIAGNOSIS — D099 Carcinoma in situ, unspecified: Secondary | ICD-10-CM

## 2024-05-15 NOTE — Progress Notes (Signed)
 "  New Patient Visit   Subjective  Harold English is a 84 y.o. male who presents for the following: Mohs of a Squamous Carcinoma in Situ on the left parietal scalp, referred by Kaitlin Phelps, PA-C.   The following portions of the chart were reviewed this encounter and updated as appropriate: medications, allergies, medical history  Review of Systems:  No other skin or systemic complaints except as noted in HPI or Assessment and Plan.  Objective  Well appearing patient in no apparent distress; mood and affect are within normal limits.  A focused examination was performed of the following areas: Left parietal scalp Relevant physical exam findings are noted in the Assessment and Plan.   Left Parietal Scalp Large cutaneous horn   Assessment & Plan   SQUAMOUS CELL CARCINOMA IN SITU Left Parietal Scalp - Mohs surgery  Consent obtained: written  Anticoagulation: Is the patient taking prescription anticoagulant and/or aspirin  prescribed/recommended by a physician? Yes   Was the anticoagulation regimen changed prior to Mohs? No    Anesthesia: Anesthesia method: local infiltration Local anesthetic: lidocaine  1% WITH epi  Procedure Details: Timeout: pre-procedure verification complete Procedure Prep: patient was prepped and draped in usual sterile fashion Prep type: chlorhexidine  Biopsy accession number: [id number] Biopsy lab: [facility] Date of biopsy: 04/29/2024 Frozen section biopsy performed: No   Specimen debulked: Yes   Pre-Op diagnosis: squamous cell carcinoma SCC subtype: in situ MohsAIQ Surgical site (if tumor spans multiple areas, please select predominant area): scalp Surgery side: left Surgical site (from skin exam): Left Parietal Scalp Pre-operative length (cm): 2.3 Pre-operative width (cm): 2.2 Indications for Mohs surgery: anatomic location where tissue conservation is critical and tumor size greater than 2 cm Previously treated? No    Micrographic  Surgery Details: Post-operative length (cm): 3 Post-operative width (cm): 3 Number of Mohs stages: 1 Post surgery depth of defect: subcutaneous fat  Stage 1    Tumor features identified on Mohs section: no tumor identified  Reconstruction: Was the defect reconstructed?: No     Return in about 4 weeks (around 06/12/2024) for wound check.   05/15/2024  HISTORY OF PRESENT ILLNESS  Harold English is seen in consultation at the request of Kaitlin Phelps, PA-C for biopsy-proven Squamous Carcinoma in Situ on the left parietal scalp. They note that the area has been present for about 1 year increasing in size with time.  There is no history of previous treatment.  Reports no other new or changing lesions and has no other complaints today.  Medications and allergies: see patient chart.  Review of systems: Reviewed 8 systems and notable for the above skin cancer.  All other systems reviewed are unremarkable/negative, unless noted in the HPI. Past medical history, surgical history, family history, social history were also reviewed and are noted in the chart/questionnaire.    PHYSICAL EXAMINATION  General: Well-appearing, in no acute distress, alert and oriented x 4. Vitals reviewed in chart (if available).   Skin: Exam reveals a 2.3 x 2.2 cm erythematous papule and biopsy scar on the left parietal scalp. There are rhytids, telangiectasias, and lentigines, consistent with photodamage.  Biopsy report(s) reviewed, confirming the diagnosis.   ASSESSMENT  1) Squamous Carcinoma in Situ of the left parietal scalp 2) photodamage 3) solar lentigines   PLAN   1. Due to location, size, histology, or recurrence and the likelihood of subclinical extension as well as the need to conserve normal surrounding tissue, the patient was deemed acceptable for Mohs micrographic surgery (  MMS).  The nature and purpose of the procedure, associated benefits and risks including recurrence and scarring, possible  complications such as pain, infection, and bleeding, and alternative methods of treatment if appropriate were discussed with the patient during consent. The lesion location was verified by the patient, by reviewing previous notes, pathology reports, and by photographs as well as angulation measurements if available.  Informed consent was reviewed and signed by the patient, and timeout was performed at 9:30 AM. See op note below.  2. For the photodamage and solar lentigines, sun protection discussed/information given on OTC sunscreens, and we recommend continued regular follow-up with primary dermatologist every 6 months or sooner for any growing, bleeding, or changing lesions. 3. Prognosis and future surveillance discussed. 4. Letter with treatment outcome sent to referring provider. 5. Pain acetaminophen /ibuprofen  MOHS MICROGRAPHIC SURGERY AND RECONSTRUCTION  Initial size:   2.3 x 2.2 cm Surgical defect/wound size: 3.0 x 3.0 cm Anesthesia:    0.33% lidocaine  with 1:200,000 epinephrine EBL:    <5 mL Complications:  None Repair type:   Second Intention  Stages: 1  STAGE I: Anesthesia achieved with 0.5% lidocaine  with 1:200,000 epinephrine. ChloraPrep applied. 2 section(s) excised using Mohs technique (this includes total peripheral and deep tissue margin excision and evaluation with frozen sections, excised and interpreted by the same physician). The tumor was first debulked and then excised with an approx. 2mm margin.  Hemostasis was achieved with electrocautery as needed.  The specimen was then oriented, subdivided/relaxed, inked, and processed using Mohs technique.    Frozen section analysis revealed a clear deep and peripheral margin.  Reconstruction  Patient was notified of results and repair options were discussed, including second intention healing. After reviewing the advantages and disadvantages of each, we agreed on second intention healing as appropriate.   The surgical site was  then lightly scrubbed with sterile, saline-soaked gauze.  The area was bandaged using Vaseline ointment, non-adherent gauze, gauze pads, and tape to provide an adequate pressure dressing.   The patient tolerated the procedure well, was given detailed written and verbal wound care instructions, and was discharged in good condition.  The patient will follow-up in 4 weeks and as scheduled with primary dermatologist.   Documentation: I have reviewed the above documentation for accuracy and completeness, and I agree with the above.  Harold CHRISTELLA HOLY, MD  "

## 2024-05-15 NOTE — Patient Instructions (Addendum)
 Second Intention Wound Care Instructions Your wound will heal naturally without stitches. This process is called second intention healing. A white bandage has been placed over the wound to help prevent infection and reduce bleeding. Keep the white pressure bandage on and dry for 48 hours (72 hours if you take blood thinners). Do not remove the bandage early. Wound Care After Bandage Removal Once the bandage is removed: Gently clean the wound with soap and water  twice daily Apply a thin layer of Vaseline or petroleum ointment Pink or clear drainage is normal during healing It is normal for the base of the wound to appear yellow, this is new skin Cover the wound with a non-stick bandage Duration of Care Continue this wound care routine until your follow-up appointment or until instructed otherwise by your provider.  IN THE EVENT OF AN AFTER HOURS EMERGENCY (PAIN, BLEEDING, INFECTION), PLEASE CALL OUR OFFICE AND YOU BE DIRECTED TO THE ON CALL STAFF. MYCHART MESSAGES SENT AFTER HOURS WILL BE ADDRESSED ON THE NEXT BUSINESS DAY  What to Watch For After Surgery Bleeding Some light bleeding is normal in the first 24 hours. To lower bleeding risk: Do not lift more than 10 pounds for 1 week If surgery was on your face, head, or neck, do not bend or stoop for 72 hours If surgery was on an arm or leg, keep it raised as much as possible (Limit standing and walking if it was on the leg) If bleeding happens: Press firmly on the area for 30 minutes without looking If bleeding continues, press again for another 30 minutes Call the office if bleeding does not stop  Swelling and Bruising Swelling and bruising are normal. Use an ice pack for 15 minutes each hour during the first 1-2 days Wrap ice in a towel to keep bandages dry Keep the area raised when possible Use extra pillows if surgery was on the head or neck Raise arms or legs near heart level if surgery was there  Infection (Uncommon) Call  the office if you notice: Increasing pain Redness or swelling Yellow drainage (pus) Symptoms starting several days after surgery  Pain Control Some pain is normal. Rest, ice, and elevation help You may take Tylenol  (acetaminophen ) and Ibuprofen (Advil/Motrin) Do not take aspirin for pain If you already take daily aspirin, continue it, but do not add extra Safe option (if allowed): Take 2 ibuprofen (200 mg each) 3 hours later take 2 Tylenol  (325 mg each) Keep alternating every 3 hours as needed If you cannot take ibuprofen: Take Tylenol  650 mg every 4-6 hours EXAMPLE: Time Medicine Dose  6:00 AM Ibuprofen 400 mg (2 tablets of 200 mg)  9:00 AM Tylenol  650 mg (2 tablets of 325 mg)  12:00 PM Ibuprofen 400 mg  3:00 PM Tylenol  650 mg  6:00 PM Ibuprofen 400 mg  9:00 PM Tylenol  650 mg    Healing and Scar Questions When will my scar fade? It takes about 1 year for the scar to mature. Redness usually fades over time. Can my wound open? Yes, the area is weak for the first 6 months. Avoid pulling or stretching it. When will feeling return? Numbness or tingling can last 1-2 years. Some numbness may be permanent. Nose stuffiness If surgery was on your nose, stuffiness can last several months and will slowly improve. When can I stop wound care? You may stop once the skin is fully healed with no open areas. Makeup and sunscreen You may use makeup and sunscreen once: Stitches  are removed, and The wound is fully healed Use sunscreen daily on healed skin.  Lumps, Puffiness, and Massage Small lumps under the skin are normal and will go away A small pimple along the scar can happen Use warm compresses Call if it does not improve Puffy scars can sometimes be treated -- call the office if concerned After 1 month, gently massage the scar 2-3 times a day  Scar Products No scar products should be applied until 1 month after your surgery Scar creams are optional Plain Vaseline works  very well and is safe Stop any product that causes irritation  Future Skin Checks You may develop skin cancer again. See your dermatologist every 6-12 months Regular skin checks help find problems early    Important Information  Due to recent changes in healthcare laws, you may see results of your pathology and/or laboratory studies on MyChart before the doctors have had a chance to review them. We understand that in some cases there may be results that are confusing or concerning to you. Please understand that not all results are received at the same time and often the doctors may need to interpret multiple results in order to provide you with the best plan of care or course of treatment. Therefore, we ask that you please give us  2 business days to thoroughly review all your results before contacting the office for clarification. Should we see a critical lab result, you will be contacted sooner.   If You Need Anything After Your Visit  If you have any questions or concerns for your doctor, please call our main line at [phone] If no one answers, please leave a voicemail as directed and we will return your call as soon as possible. Messages left after 4 pm will be answered the following business day.   You may also send us  a message via MyChart. We typically respond to MyChart messages within 1-2 business days.  For prescription refills, please ask your pharmacy to contact our office. Our fax number is [phone].  If you have an urgent issue when the clinic is closed that cannot wait until the next business day, you can page your doctor at the number below.    Please note that while we do our best to be available for urgent issues outside of office hours, we are not available 24/7.   If you have an urgent issue and are unable to reach us , you may choose to seek medical care at your doctor's office, retail clinic, urgent care center, or emergency room.  If you have a medical  emergency, please immediately call 911 or go to the emergency department. In the event of inclement weather, please call our main line at [phone] for an update on the status of any delays or closures.  Dermatology Medication Tips: Please keep the boxes that topical medications come in in order to help keep track of the instructions about where and how to use these. Pharmacies typically print the medication instructions only on the boxes and not directly on the medication tubes.   If your medication is too expensive, please contact our office at [phone] or send us  a message through MyChart.   We are unable to tell what your co-pay for medications will be in advance as this is different depending on your insurance coverage. However, we may be able to find a substitute medication at lower cost or fill out paperwork to get insurance to cover a needed medication.   If a prior  authorization is required to get your medication covered by your insurance company, please allow us  1-2 business days to complete this process.  Drug prices often vary depending on where the prescription is filled and some pharmacies may offer cheaper prices.  The website www.goodrx.com contains coupons for medications through different pharmacies. The prices here do not account for what the cost may be with help from insurance (it may be cheaper with your insurance), but the website can give you the price if you did not use any insurance.  - You can print the associated coupon and take it with your prescription to the pharmacy.  - You may also stop by our office during regular business hours and pick up a GoodRx coupon card.  - If you need your prescription sent electronically to a different pharmacy, notify our office through [facility] or by phone at [phone]

## 2024-06-12 ENCOUNTER — Ambulatory Visit: Admitting: Dermatology

## 2024-06-24 ENCOUNTER — Encounter: Admitting: Dermatology

## 2024-07-08 ENCOUNTER — Encounter: Admitting: Dermatology
